# Patient Record
Sex: Female | Born: 1975 | Race: White | Hispanic: No | Marital: Married | Smoking: Never smoker
Health system: Southern US, Community
[De-identification: ages and names within clinical notes are randomized; demographics above are authoritative.]

## PROBLEM LIST (undated history)

## (undated) DIAGNOSIS — N6009 Solitary cyst of unspecified breast: Secondary | ICD-10-CM

## (undated) DIAGNOSIS — F419 Anxiety disorder, unspecified: Secondary | ICD-10-CM

## (undated) DIAGNOSIS — R519 Headache, unspecified: Secondary | ICD-10-CM

## (undated) DIAGNOSIS — T7840XA Allergy, unspecified, initial encounter: Secondary | ICD-10-CM

## (undated) DIAGNOSIS — R87610 Atypical squamous cells of undetermined significance on cytologic smear of cervix (ASC-US): Secondary | ICD-10-CM

## (undated) DIAGNOSIS — R51 Headache: Secondary | ICD-10-CM

## (undated) HISTORY — DX: Solitary cyst of unspecified breast: N60.09

## (undated) HISTORY — PX: TYMPANOSTOMY TUBE PLACEMENT: SHX32

## (undated) HISTORY — PX: TONSILLECTOMY AND ADENOIDECTOMY: SHX28

## (undated) HISTORY — PX: IUD REMOVAL: SHX5392

## (undated) HISTORY — DX: Headache: R51

## (undated) HISTORY — PX: OTHER SURGICAL HISTORY: SHX169

## (undated) HISTORY — DX: Headache, unspecified: R51.9

## (undated) HISTORY — DX: Allergy, unspecified, initial encounter: T78.40XA

## (undated) HISTORY — PX: TEMPOROMANDIBULAR JOINT SURGERY: SHX35

## (undated) HISTORY — DX: Atypical squamous cells of undetermined significance on cytologic smear of cervix (ASC-US): R87.610

## (undated) HISTORY — PX: THERAPEUTIC ABORTION: SHX798

## (undated) HISTORY — DX: Anxiety disorder, unspecified: F41.9

---

## 2000-01-14 ENCOUNTER — Other Ambulatory Visit: Admission: RE | Admit: 2000-01-14 | Discharge: 2000-01-14 | Payer: Self-pay | Admitting: Obstetrics and Gynecology

## 2007-06-17 ENCOUNTER — Other Ambulatory Visit: Admission: RE | Admit: 2007-06-17 | Discharge: 2007-06-17 | Payer: Self-pay | Admitting: Gynecology

## 2008-09-09 ENCOUNTER — Ambulatory Visit: Payer: Self-pay | Admitting: Gynecology

## 2008-09-09 ENCOUNTER — Other Ambulatory Visit: Admission: RE | Admit: 2008-09-09 | Discharge: 2008-09-09 | Payer: Self-pay | Admitting: Gynecology

## 2008-09-09 ENCOUNTER — Encounter: Payer: Self-pay | Admitting: Gynecology

## 2009-09-14 ENCOUNTER — Ambulatory Visit: Payer: Self-pay | Admitting: Gynecology

## 2009-09-14 ENCOUNTER — Other Ambulatory Visit: Admission: RE | Admit: 2009-09-14 | Discharge: 2009-09-14 | Payer: Self-pay | Admitting: Gynecology

## 2010-06-28 ENCOUNTER — Encounter: Admission: RE | Admit: 2010-06-28 | Discharge: 2010-06-28 | Payer: Self-pay | Admitting: Otolaryngology

## 2010-09-17 ENCOUNTER — Other Ambulatory Visit
Admission: RE | Admit: 2010-09-17 | Discharge: 2010-09-17 | Payer: Self-pay | Source: Home / Self Care | Admitting: Gynecology

## 2010-09-17 ENCOUNTER — Ambulatory Visit: Payer: Self-pay | Admitting: Gynecology

## 2010-10-21 ENCOUNTER — Encounter: Payer: Self-pay | Admitting: Otolaryngology

## 2011-01-02 ENCOUNTER — Other Ambulatory Visit: Payer: Self-pay | Admitting: Gynecology

## 2011-01-02 DIAGNOSIS — Z803 Family history of malignant neoplasm of breast: Secondary | ICD-10-CM

## 2011-01-07 ENCOUNTER — Ambulatory Visit
Admission: RE | Admit: 2011-01-07 | Discharge: 2011-01-07 | Disposition: A | Discharge status: Home / Self Care | Payer: BLUE CROSS/BLUE SHIELD | Source: Ambulatory Visit | Attending: Gynecology | Admitting: Gynecology

## 2011-01-07 DIAGNOSIS — Z803 Family history of malignant neoplasm of breast: Secondary | ICD-10-CM

## 2011-09-19 ENCOUNTER — Ambulatory Visit (INDEPENDENT_AMBULATORY_CARE_PROVIDER_SITE_OTHER): Payer: BC Managed Care – PPO | Admitting: Gynecology

## 2011-09-19 ENCOUNTER — Encounter: Payer: Self-pay | Admitting: Gynecology

## 2011-09-19 VITALS — BP 116/64 | Ht 67.0 in | Wt 150.0 lb

## 2011-09-19 DIAGNOSIS — Z13220 Encounter for screening for lipoid disorders: Secondary | ICD-10-CM

## 2011-09-19 DIAGNOSIS — R635 Abnormal weight gain: Secondary | ICD-10-CM

## 2011-09-19 DIAGNOSIS — Z30431 Encounter for routine checking of intrauterine contraceptive device: Secondary | ICD-10-CM

## 2011-09-19 DIAGNOSIS — Z131 Encounter for screening for diabetes mellitus: Secondary | ICD-10-CM

## 2011-09-19 DIAGNOSIS — Z01419 Encounter for gynecological examination (general) (routine) without abnormal findings: Secondary | ICD-10-CM

## 2011-09-19 LAB — COMPREHENSIVE METABOLIC PANEL
AST: 16 U/L (ref 0–37)
Alkaline Phosphatase: 43 U/L (ref 39–117)
BUN: 11 mg/dL (ref 6–23)
Calcium: 9.9 mg/dL (ref 8.4–10.5)
Chloride: 104 mEq/L (ref 96–112)
Creat: 0.8 mg/dL (ref 0.50–1.10)

## 2011-09-19 MED ORDER — ALPRAZOLAM 0.5 MG PO TABS: 0.5000 mg | ORAL_TABLET | Freq: Every evening | ORAL | Status: AC | PRN

## 2011-09-19 NOTE — Patient Instructions (Signed)
Try Xanax as discussed for PMS symptoms. Follow up for annual exam in one year.

## 2011-09-19 NOTE — Progress Notes (Signed)
Cheryl Webster 05-Jun-1976 [id number]        35 y.o.  for annual exam.  Doing well although has several issues noted below  Past medical history,surgical history, medications, allergies, family history and social history were all reviewed and documented in the EPIC chart. ROS:  Was performed and pertinent positives and negatives are included in the history.  Exam: chaperone present Filed Vitals:   09/19/11 0932  BP: 116/64   General appearance  Normal Skin grossly normal Head/Neck normal with no cervical or supraclavicular adenopathy thyroid normal Lungs  clear Cardiac RR, without RMG Abdominal  soft, nontender, without masses, organomegaly or hernia Breasts  examined lying and sitting without masses, retractions, discharge or axillary adenopathy. Pelvic  Ext/BUS/vagina  normal small mole left lower perineum  Cervix  normal  IUD string not visualized grossly. Was seen with the colposcope within the external os.  Uterus  anteverted, normal size, shape and contour, midline and mobile nontender   Adnexa  Without masses or tenderness    Anus and perineum  normal   Rectovaginal  normal sphincter tone without palpated masses or tenderness.    Assessment/Plan:  35 y.o. female for annual exam.    1. PMS symptoms. Patient notes some PMS symptoms several days before her period with anxiety and tension. Options for management were reviewed to include exercise diet fluoxetine and Xanax. Patient's interest in trying Xanax.  I prescribed 0.5 mg #30 one refill to be used 1/2-1 tab when necessary. 2. Weight gain. Patient notes 15 pound weight gain without change in diet and exercise. We'll check TSH and comprehensive metabolic panel. I discussed diet and exercise with her we'll see if this doesn't stabilize. 3. Breast health. SBE monthly reviewed. She did have a screening mammogram 2011 will repeat at 40. 4. IUD management. IUD string was not initially visible but with the colposcope was seen within  the cervical os. She's doing well with scant to absent menses. IUD was placed July 2009. 5. Pap smear. She has 3 consecutive normal Pap smears in her chart the last in 2011. I discussed current guidelines and did not do a Pap smear this year we'll plan every 3 or Pap smears and she agrees with this. 6. Mole left perineum. The patient is a small mole benign in appearance and she reports that has remained unchanged for years. She'll continue to monitor report any change. 7. Health maintenance. Will check baseline CBC lipid profile urinalysis along with her conference of metabolic panel and TSH.    Dara Lords MD, 10:01 AM 09/19/2011

## 2011-12-05 DIAGNOSIS — G629 Polyneuropathy, unspecified: Secondary | ICD-10-CM | POA: Insufficient documentation

## 2012-09-24 ENCOUNTER — Ambulatory Visit (INDEPENDENT_AMBULATORY_CARE_PROVIDER_SITE_OTHER): Payer: BC Managed Care – PPO | Admitting: Gynecology

## 2012-09-24 ENCOUNTER — Encounter: Payer: Self-pay | Admitting: Gynecology

## 2012-09-24 VITALS — BP 110/72 | Ht 66.5 in | Wt 152.0 lb

## 2012-09-24 DIAGNOSIS — Z13220 Encounter for screening for lipoid disorders: Secondary | ICD-10-CM

## 2012-09-24 DIAGNOSIS — Z01419 Encounter for gynecological examination (general) (routine) without abnormal findings: Secondary | ICD-10-CM

## 2012-09-24 DIAGNOSIS — N949 Unspecified condition associated with female genital organs and menstrual cycle: Secondary | ICD-10-CM

## 2012-09-24 DIAGNOSIS — Z131 Encounter for screening for diabetes mellitus: Secondary | ICD-10-CM

## 2012-09-24 DIAGNOSIS — R102 Pelvic and perineal pain: Secondary | ICD-10-CM

## 2012-09-24 LAB — CBC WITH DIFFERENTIAL/PLATELET
Eosinophils Absolute: 0.1 10*3/uL (ref 0.0–0.7)
Hemoglobin: 12.6 g/dL (ref 12.0–15.0)
Lymphs Abs: 2.1 10*3/uL (ref 0.7–4.0)
MCH: 31.3 pg (ref 26.0–34.0)
Monocytes Relative: 6 % (ref 3–12)
Neutrophils Relative %: 61 % (ref 43–77)
RBC: 4.03 MIL/uL (ref 3.87–5.11)

## 2012-09-24 LAB — LIPID PANEL
Cholesterol: 159 mg/dL (ref 0–200)
HDL: 60 mg/dL (ref >39)
Total CHOL/HDL Ratio: 2.7 Ratio
Triglycerides: 163 mg/dL — ABNORMAL HIGH (ref <150)

## 2012-09-24 LAB — GLUCOSE, RANDOM: Glucose, Bld: 92 mg/dL (ref 70–99)

## 2012-09-24 NOTE — Progress Notes (Signed)
Cheryl Webster Dec 28, 1975 [id number]        36 y.o.  [id number] for annual exam.  Several issues below.  Past medical history,surgical history, medications, allergies, family history and social history were all reviewed and documented in the EPIC chart. ROS:  Was performed and pertinent positives and negatives are included in the history.  Exam: Fleet Contras assistant Filed Vitals:   09/24/12 1142  BP: 110/72  Height: 5' 6.5" (1.689 m)  Weight: 152 lb (68.947 kg)   General appearance  Normal Skin grossly normal Head/Neck normal with no cervical or supraclavicular adenopathy thyroid normal Lungs  clear Cardiac RR, without RMG Abdominal  soft, nontender, without masses, organomegaly or hernia Breasts  examined lying and sitting without masses, retractions, discharge or axillary adenopathy. Pelvic  Ext/BUS/vagina  normal with small benign-appearing mole left lower perineum.  Cervix  normal IUD string visualized with colposcope and endocervical speculum  Uterus  anteverted, normal size, shape and contour, midline and mobile nontender   Adnexa  Without masses or tenderness    Anus and perineum  normal   Rectovaginal  normal sphincter tone without palpated masses or tenderness.    Assessment/Plan:  36 y.o. [id number] female for annual exam.   1. Pelvic cramping. Patient notes little more pelvic cramping preceding and following her menses. Also the more breast tenderness. Has light menses every several months. IUD due to be exchanged/removed July 2014. Will check ultrasound rule out nonpalpable abnormalities. Suspect due to IUD coming to the end of 5 years. Follow up for ultrasound and then we'll go from there. 2. Mirena IUD due to be replaced or removed July 2014. Patient knows the timeframe and the need to do so. 3. Mammogram 12/2010 normal. Plan repeat at age 24. SBE monthly reviewed. 4. Pap smear 08/2010. Do Pap smear done today. No history of significant abnormalities. Plan repeat next year at 3  year interval. 5. Perineal mole. Stable on exam and patient's self-assessment. Patient will continue to monitor. Report any changes. 6. Health maintenance. Baseline CBC glucose lipid profile urinalysis ordered. Follow up ultrasound.    Dara Lords MD, 12:11 PM 09/24/2012

## 2012-09-24 NOTE — Patient Instructions (Signed)
Follow up for ultrasound as scheduled 

## 2012-09-25 LAB — URINALYSIS W MICROSCOPIC + REFLEX CULTURE
Bilirubin Urine: NEGATIVE
Glucose, UA: NEGATIVE mg/dL
Hgb urine dipstick: NEGATIVE
Ketones, ur: NEGATIVE mg/dL
Protein, ur: NEGATIVE mg/dL

## 2012-10-01 ENCOUNTER — Encounter: Payer: Self-pay | Admitting: Gynecology

## 2012-10-01 ENCOUNTER — Ambulatory Visit (INDEPENDENT_AMBULATORY_CARE_PROVIDER_SITE_OTHER): Payer: BC Managed Care – PPO

## 2012-10-01 ENCOUNTER — Ambulatory Visit (INDEPENDENT_AMBULATORY_CARE_PROVIDER_SITE_OTHER): Payer: BC Managed Care – PPO | Admitting: Gynecology

## 2012-10-01 DIAGNOSIS — R102 Pelvic and perineal pain: Secondary | ICD-10-CM

## 2012-10-01 DIAGNOSIS — N83209 Unspecified ovarian cyst, unspecified side: Secondary | ICD-10-CM

## 2012-10-01 DIAGNOSIS — N949 Unspecified condition associated with female genital organs and menstrual cycle: Secondary | ICD-10-CM

## 2012-10-01 DIAGNOSIS — T839XXA Unspecified complication of genitourinary prosthetic device, implant and graft, initial encounter: Secondary | ICD-10-CM

## 2012-10-01 NOTE — Patient Instructions (Signed)
Follow up for ultrasound as scheduled in 2-3 months. Follow up sooner if pain worsens or changes.

## 2012-10-01 NOTE — Progress Notes (Signed)
Patient presents for ultrasound. She was complaining of menstrual cramping usually starting before and during her menses. Has Mirena IUD in place due to be replaced July 2014. No pain in between her periods.  Ultrasound shows uterus generous in size with endometrial echo 2.8 mm. IUD visualized in normal position. Right ovary with 3 thin-walled echo-free a vascular cysts measuring 25 mm mean, 30 mm mean, 24 mm mean. Left ovary with 27.5 mm mean a vascular simple cyst. Cul-de-sac negative.  Assessment and plan:. Menstrual cramping. Ultrasound with cystic changes bilaterally benign in appearance. Do not think cysts etiology of her pain given that the pain is usually premenstrual and not throughout the month. Possibilities for endometriosis also reviewed. Although these do not look like classic endometriomas. Benign versus malignant possibilities also discussed. Recommend reultrasound in 2-3 months. Possible laparoscopy if persists or enlarges. Patient understands accepts and is comfortable with the plan.

## 2012-10-02 ENCOUNTER — Ambulatory Visit: Payer: BC Managed Care – PPO | Admitting: Gynecology

## 2012-10-02 ENCOUNTER — Other Ambulatory Visit: Payer: BC Managed Care – PPO

## 2012-11-30 ENCOUNTER — Telehealth: Payer: Self-pay | Admitting: *Deleted

## 2012-11-30 ENCOUNTER — Encounter: Payer: Self-pay | Admitting: Gynecology

## 2012-11-30 ENCOUNTER — Ambulatory Visit (INDEPENDENT_AMBULATORY_CARE_PROVIDER_SITE_OTHER): Payer: BC Managed Care – PPO | Admitting: Gynecology

## 2012-11-30 ENCOUNTER — Ambulatory Visit: Payer: BC Managed Care – PPO | Admitting: Gynecology

## 2012-11-30 ENCOUNTER — Telehealth: Payer: Self-pay | Admitting: Gynecology

## 2012-11-30 DIAGNOSIS — N644 Mastodynia: Secondary | ICD-10-CM

## 2012-11-30 NOTE — Telephone Encounter (Signed)
Appt. 12/01/12 2 9:40 am at breast center.

## 2012-11-30 NOTE — Telephone Encounter (Signed)
Message copied by Aura Camps on Mon Nov 30, 2012 12:44 PM ------      Message from: Dara Lords      Created: Mon Nov 30, 2012 10:58 AM       Arrange ultrasound and diagnostic mammography right breast reference tenderness with patient felt mass tail of Spence not appreciated by physician. Extremely anxious patient ------

## 2012-11-30 NOTE — Telephone Encounter (Signed)
On Call Note:  Found lump in breast. Recommended call office to make appt for evaluation.

## 2012-11-30 NOTE — Progress Notes (Signed)
Patient presents complaining of right breast lump discovered this weekend. History of one week of right breast tenderness and then she felt a small lump that she never noticed before. Her LMP 11/12/2012 was 2 weeks late. Has had regular menses routinely.  Also notes a small bump in front of her left ear opening been there for years fluctuates in size.  Exam with [name] Asst. HEENT with small nodule several millimeters mobile firm in front of her left earlobe. No overlying skin changes or cervical adenopathy. Both breast examined lying and sitting.  No masses retractions discharge adenopathy. She does have more breast tissue in the right tail of Spence but no definitive masses.  Assessment and plan: 1. Small nodule anterior to left ear opening. Then present for years fluctuates I think this is a small sebaceous cyst. She actually saw an ENT physician previously for this who recommended observation and I concur. 2. Questionable breast mass right tail of Spence the patient. Not palpated by physician. She does have more breast tissue here I think that she's having some hormonal changes do to her last irregular cycle. Recommend ultrasound/diagnostic mammogram as the patient is quite anxious. Assuming negative we'll follow him assuming this area resolves after her next menses and will follow. We'll continue to be tender or she thinks she feels anything to followup with me.  Patient knows that we will call her to help arrange the mammogram ultrasound.

## 2012-11-30 NOTE — Patient Instructions (Signed)
Office will contact you to arrange ultrasound and mammogram. Followup when you're due for your ultrasound for the ovarian cyst.

## 2012-11-30 NOTE — Telephone Encounter (Signed)
Order placed at breast center. They will contact patient. 

## 2012-12-01 ENCOUNTER — Ambulatory Visit
Admission: RE | Admit: 2012-12-01 | Discharge: 2012-12-01 | Disposition: A | Discharge status: Home / Self Care | Payer: BC Managed Care – PPO | Source: Ambulatory Visit | Attending: Gynecology | Admitting: Gynecology

## 2012-12-01 DIAGNOSIS — N644 Mastodynia: Secondary | ICD-10-CM

## 2012-12-03 ENCOUNTER — Ambulatory Visit: Payer: BC Managed Care – PPO | Admitting: Gynecology

## 2012-12-03 ENCOUNTER — Other Ambulatory Visit: Payer: BC Managed Care – PPO

## 2012-12-04 ENCOUNTER — Other Ambulatory Visit: Payer: BC Managed Care – PPO

## 2012-12-04 ENCOUNTER — Ambulatory Visit: Payer: BC Managed Care – PPO | Admitting: Gynecology

## 2012-12-23 ENCOUNTER — Ambulatory Visit (INDEPENDENT_AMBULATORY_CARE_PROVIDER_SITE_OTHER): Payer: BC Managed Care – PPO | Admitting: Gynecology

## 2012-12-23 ENCOUNTER — Encounter: Payer: Self-pay | Admitting: Gynecology

## 2012-12-23 ENCOUNTER — Ambulatory Visit (INDEPENDENT_AMBULATORY_CARE_PROVIDER_SITE_OTHER): Payer: BC Managed Care – PPO

## 2012-12-23 DIAGNOSIS — N644 Mastodynia: Secondary | ICD-10-CM

## 2012-12-23 DIAGNOSIS — R102 Pelvic and perineal pain: Secondary | ICD-10-CM

## 2012-12-23 DIAGNOSIS — N83209 Unspecified ovarian cyst, unspecified side: Secondary | ICD-10-CM

## 2012-12-23 DIAGNOSIS — N949 Unspecified condition associated with female genital organs and menstrual cycle: Secondary | ICD-10-CM

## 2012-12-23 DIAGNOSIS — Z30431 Encounter for routine checking of intrauterine contraceptive device: Secondary | ICD-10-CM

## 2012-12-23 NOTE — Patient Instructions (Signed)
Followup in December 2014 for annual exam. Sooner if any issues.

## 2012-12-23 NOTE — Progress Notes (Signed)
Patient presents for followup ultrasound.  Prior ultrasound in January showed bilateral ovarian cystic changes felt to be physiologic and she was asked to represent for repeat ultrasound in several months to make sure these results. The patient also decided to have her IUD removed today. Her husband is going through with a vasectomy and she preferred to have the IUD removed.  She was having some mastalgia at her last visit in March and a questionable mass in the right tail of Spence. Followup mammogram ultrasound showed a small benign-appearing cyst otherwise normal and recommendation was to repeat her mammography at age 72. She notes that she's doing better from a breast discomfort standpoint.  Ultrasound today shows uterus overall normal. IUD located within the cavity. Endometrial echo 2.9 mm. Right ovary with 2 small avascular simple cysts. Left ovary with particular cyst 29 x 24. Cul-de-sac negative.  Exam with Selena Batten assistant External BUS vagina normal. Cervix normal with IUD string visualized. String was grasped with a [place] forcep in her Mirena IUD was removed, shown to her and discarded.  Assessment and plan: 1. Right breast mastalgia, resolved. Evidence of small benign-appearing cyst in the right tail of Spence otherwise normal ultrasound/mammogram. Recommendation per radiology was to followup mammogram at age 89. Represent a any recurrent pain or masses. 2. Bilateral ovarian cystic changes. Smaller on the right. Looks different on the left now hemorrhagic consistent with corpus luteum. Field both are physiologic and no further workup needed and patient agrees. 3. IUD management. The Mirena IUD was removed. Patient knows the need to use contraception until husband has to vasectomy and he is cleared afterwards. Followup December 2014 for annual exam, sooner as needed.

## 2012-12-29 ENCOUNTER — Other Ambulatory Visit: Payer: Self-pay | Admitting: Gynecology

## 2012-12-30 ENCOUNTER — Other Ambulatory Visit: Payer: Self-pay | Admitting: Gynecology

## 2012-12-30 NOTE — Telephone Encounter (Signed)
rx called in. Spoke with Fayrene Fearing

## 2013-02-08 ENCOUNTER — Telehealth: Payer: Self-pay | Admitting: *Deleted

## 2013-02-08 NOTE — Telephone Encounter (Signed)
Pt informed with the below note. 

## 2013-02-08 NOTE — Telephone Encounter (Signed)
If no chance of pregnancy then wait another 1-2 weeks. If without menses and office visit for evaluation. If any chance of pregnancy then check pregnancy test.

## 2013-02-08 NOTE — Telephone Encounter (Signed)
Pt had IUD removed on 12/23/12 pt said she has not had a cycle yet, no chance of pregnancy, she has not took a UPT. Last cycle was in Feb 2014, she had 1 week of breast tenderness but bleeding. Please advise

## 2013-02-26 ENCOUNTER — Ambulatory Visit (INDEPENDENT_AMBULATORY_CARE_PROVIDER_SITE_OTHER): Payer: BC Managed Care – PPO | Admitting: Gynecology

## 2013-02-26 ENCOUNTER — Encounter: Payer: Self-pay | Admitting: Gynecology

## 2013-02-26 DIAGNOSIS — N6001 Solitary cyst of right breast: Secondary | ICD-10-CM

## 2013-02-26 DIAGNOSIS — N644 Mastodynia: Secondary | ICD-10-CM

## 2013-02-26 DIAGNOSIS — R102 Pelvic and perineal pain: Secondary | ICD-10-CM

## 2013-02-26 DIAGNOSIS — N6009 Solitary cyst of unspecified breast: Secondary | ICD-10-CM

## 2013-02-26 DIAGNOSIS — N949 Unspecified condition associated with female genital organs and menstrual cycle: Secondary | ICD-10-CM

## 2013-02-26 NOTE — Progress Notes (Signed)
Patient presents with 2 issues: 1. Right breast pain. History of same over the last several months. Had diagnostic mammogram and ultrasound 11/2012 which showed a small benign-appearing cyst in the upper outer quadrant. She notes that her breast pain has continued on a daily basis. No nipple discharge or other palpable abnormalities other than the staple area where the cyst was palpated. 2. Bilateral pelvic pain. Patient notes pain on both sides of her pelvis starting before her last menstrual period and has continued on for the last 2 weeks. Nagging daily pain. No dyspareunia or abnormal bleeding.   Exam with Blanca Asst. Bilateral breasts examined lying and sitting. Left without masses retractions discharge adenopathy. Right with small pea-sized nodule subcutaneous upper outer quadrant freely mobile no overlying skin changes. No nipple discharge or axillary adenopathy. Abdomen soft nontender without masses guarding rebound organomegaly. Pelvic external BUS vagina normal. Cervix normal. Uterus normal size midline mobile nontender. Adnexa without masses or tenderness  Assessment and plan: 1. Right breast pain. Recent negative mammogram and ultrasound consistent with small benign cyst. Options for Gen. surgical referral now or observation reviewed. Patient would prefer observation we'll plan on heat and nonsteroidal anti-inflammatories. Possible androgen such as danazol reviewed. Patient will call me if this tenderness persists I may refer to general surgeon. 2. Pelvic pain. Recent history of recurrent bilateral ovarian cysts which have resolved spontaneously. We'll go ahead and schedule ultrasound now for reassessment of the pelvis. Patient will followup for this and we'll go from there. Her husband is in the process of arranging vasectomy.

## 2013-02-26 NOTE — Patient Instructions (Signed)
Follow up for ultrasound as scheduled 

## 2013-03-10 ENCOUNTER — Encounter: Payer: Self-pay | Admitting: Gynecology

## 2013-03-10 ENCOUNTER — Ambulatory Visit (INDEPENDENT_AMBULATORY_CARE_PROVIDER_SITE_OTHER): Payer: BC Managed Care – PPO

## 2013-03-10 ENCOUNTER — Ambulatory Visit (INDEPENDENT_AMBULATORY_CARE_PROVIDER_SITE_OTHER): Payer: BC Managed Care – PPO | Admitting: Gynecology

## 2013-03-10 DIAGNOSIS — R102 Pelvic and perineal pain: Secondary | ICD-10-CM

## 2013-03-10 DIAGNOSIS — N831 Corpus luteum cyst of ovary, unspecified side: Secondary | ICD-10-CM

## 2013-03-10 DIAGNOSIS — R197 Diarrhea, unspecified: Secondary | ICD-10-CM

## 2013-03-10 DIAGNOSIS — N949 Unspecified condition associated with female genital organs and menstrual cycle: Secondary | ICD-10-CM

## 2013-03-10 DIAGNOSIS — N83209 Unspecified ovarian cyst, unspecified side: Secondary | ICD-10-CM

## 2013-03-10 DIAGNOSIS — N839 Noninflammatory disorder of ovary, fallopian tube and broad ligament, unspecified: Secondary | ICD-10-CM

## 2013-03-10 NOTE — Progress Notes (Signed)
Patient presents in followup for ultrasound.  Has been following bilateral ovarian cystic changes of the past several months starting in January. Had left ovarian cyst-looking hemorrhagic at 25 mm mean in January. Followup in March was 26 mm. Patient notes that she still is having intermittent lower abdominal discomfort but also notes bouts of diarrhea along with this. She had her IUD removed in March as her husband was planning vasectomy.  Ultrasound shows uterus generous in size with normal echotexture. Endometrial echo 7.5 mm. Right ovary with physiologic changes. Left ovary with particular echo pattern 30 mm mean avascular cysts. Cul-de-sac negative.  Assessment and plan: Persistent left ovarian cyst hemorrhagic in appearance. Ill-defined lower abdominal pain coming and going with diarrhea. Recommend starting with GI evaluation for your irritable bowel. Differential as far as ovaries include physiologic hemorrhagic cyst versus endometrioma highly unlikely neoplasm although possible. Options for laparoscopy now versus re\re ultrasound in 3 months discussed. At this point patient wants to start with GI evaluation. She will followup with me if she wants to proceed with laparoscopy. If she does not proceed with laparoscopy then she knows the need to repeat the ultrasound in 3 months.

## 2013-03-10 NOTE — Patient Instructions (Signed)
Office will contact you to arrange gastroenterology appointment. If you want to proceed with laparoscopy call the office. If we do not proceed with laparoscopy then we need to repeat the ultrasound to followup on the ovarian cyst in 3 months.

## 2013-03-11 ENCOUNTER — Encounter: Payer: Self-pay | Admitting: Gastroenterology

## 2013-03-11 ENCOUNTER — Telehealth: Payer: Self-pay | Admitting: *Deleted

## 2013-03-11 DIAGNOSIS — R1032 Left lower quadrant pain: Secondary | ICD-10-CM

## 2013-03-11 NOTE — Telephone Encounter (Signed)
Appointment on 03/18/13 with Dr.Patterson. Left message for pt to call.

## 2013-03-11 NOTE — Telephone Encounter (Signed)
Message copied by Aura Camps on Thu Mar 11, 2013 11:34 AM ------      Message from: Dara Lords      Created: Wed Mar 10, 2013  4:45 PM       Gastroenterology appointment with [facility]. Reference lower abdominal pain, bouts of diarrhea. ------

## 2013-03-11 NOTE — Telephone Encounter (Signed)
Appt. On 03/18/13 @ 9:45 am

## 2013-03-12 NOTE — Telephone Encounter (Signed)
Pt informed with the below note. 

## 2013-03-16 ENCOUNTER — Encounter: Payer: Self-pay | Admitting: Gastroenterology

## 2013-03-18 ENCOUNTER — Ambulatory Visit: Payer: BC Managed Care – PPO | Admitting: Gastroenterology

## 2013-03-24 ENCOUNTER — Other Ambulatory Visit: Payer: BC Managed Care – PPO

## 2013-03-24 ENCOUNTER — Ambulatory Visit: Payer: BC Managed Care – PPO | Admitting: Gynecology

## 2013-03-25 ENCOUNTER — Encounter: Payer: Self-pay | Admitting: *Deleted

## 2013-03-30 ENCOUNTER — Other Ambulatory Visit (INDEPENDENT_AMBULATORY_CARE_PROVIDER_SITE_OTHER): Payer: BC Managed Care – PPO

## 2013-03-30 ENCOUNTER — Encounter: Payer: Self-pay | Admitting: Gastroenterology

## 2013-03-30 ENCOUNTER — Ambulatory Visit (INDEPENDENT_AMBULATORY_CARE_PROVIDER_SITE_OTHER): Payer: BC Managed Care – PPO | Admitting: Gastroenterology

## 2013-03-30 VITALS — BP 100/64 | HR 58 | Ht 65.75 in | Wt 151.0 lb

## 2013-03-30 DIAGNOSIS — R109 Unspecified abdominal pain: Secondary | ICD-10-CM

## 2013-03-30 DIAGNOSIS — N83209 Unspecified ovarian cyst, unspecified side: Secondary | ICD-10-CM

## 2013-03-30 DIAGNOSIS — R197 Diarrhea, unspecified: Secondary | ICD-10-CM

## 2013-03-30 LAB — IBC PANEL
Iron: 82 ug/dL (ref 42–145)
Transferrin: 219.2 mg/dL (ref 212.0–360.0)

## 2013-03-30 LAB — VITAMIN B12: Vitamin B-12: 681 pg/mL (ref 211–911)

## 2013-03-30 LAB — HEPATIC FUNCTION PANEL
ALT: 14 U/L (ref 0–35)
AST: 16 U/L (ref 0–37)
Albumin: 4.5 g/dL (ref 3.5–5.2)

## 2013-03-30 LAB — CBC WITH DIFFERENTIAL/PLATELET
Basophils Absolute: 0 10*3/uL (ref 0.0–0.1)
Basophils Relative: 0.4 % (ref 0.0–3.0)
Eosinophils Relative: 1.9 % (ref 0.0–5.0)
HCT: 41.5 % (ref 36.0–46.0)
Hemoglobin: 14.1 g/dL (ref 12.0–15.0)
Lymphocytes Relative: 26.5 % (ref 12.0–46.0)
Monocytes Relative: 5.8 % (ref 3.0–12.0)
Neutro Abs: 5.5 10*3/uL (ref 1.4–7.7)
RBC: 4.43 Mil/uL (ref 3.87–5.11)
WBC: 8.3 10*3/uL (ref 4.5–10.5)

## 2013-03-30 LAB — BASIC METABOLIC PANEL
CO2: 29 mEq/L (ref 19–32)
Chloride: 102 mEq/L (ref 96–112)
Creatinine, Ser: 0.8 mg/dL (ref 0.4–1.2)
Glucose, Bld: 94 mg/dL (ref 70–99)

## 2013-03-30 LAB — TSH: TSH: 1.42 u[IU]/mL (ref 0.35–5.50)

## 2013-03-30 LAB — FERRITIN: Ferritin: 27.5 ng/mL (ref 10.0–291.0)

## 2013-03-30 NOTE — Progress Notes (Signed)
History of Present Illness:  This is a 37 year old Caucasian female further evaluation of vague right upper quadrant discomfort with some abdominal gas, bloating, and occasional soft stool.  She has known ovarian cyst is followed by Dr. Audie Box in gynecology.  She also has a benign right breast cyst.  Recently she has become a vegetarian, uses a large amount of fiber, probiotics, and a variety of extracts.  She denies dyspepsia, reflux symptoms, dysphagia, or any history of known hepatobiliary problems, hepatitis or pancreatitis.  Her appetite is good her weight is stable and she denies a specific food intolerances such as lactose or gluten.  She denies melena, hematochezia, and has occasional vague right upper quadrant pain which is exacerbated by a running long distances, but is very transient in nature.  Her family history is remarkable for gallbladder disease in her father in several family members on his side.  Patient not had previous barium studies or endoscopic exams of her intestines.  She denies systemic complaints such as fever, chills, skin rashes, joint pains, or visual difficulties.  Her grandmother did have rectal cancer at age 34.  I have reviewed this patient's present history, medical and surgical past history, allergies and medications.     ROS:   All systems were reviewed and are negative unless otherwise stated in the HPI.  Allergies  Allergen Reactions  . Aleve (Naproxen Sodium)     It was the Aleve cold and sinus therefore patient doesn't take any aleve  . Sulfa Antibiotics    Outpatient Prescriptions Prior to Visit  Medication Sig Dispense Refill  . ALPRAZolam (XANAX) 0.5 MG tablet TAKE 1 TABLET BY MOUTH AT BEDTIME AS NEEDED FOR ANXIETY  30 tablet  1  . Cranberry Extract 200 MG CAPS Take by mouth.      . Lactobacillus (ACIDOPHILUS PO) Take by mouth.        . Multiple Vitamins-Iron (MULTIVITAMIN/IRON PO) Take by mouth.        . Omega-3 Fatty Acids (SALMON OIL PO) Take by  mouth.        . ALPRAZolam (XANAX) 0.25 MG tablet Take 0.25 mg by mouth at bedtime as needed for sleep.      . Cholecalciferol ([employer] VIT D 5000 HIGH-POTENCY PO) Take by mouth.      . ECHINACEA PO Take by mouth. Prn       . levonorgestrel (MIRENA) 20 MCG/24HR IUD 1 each by Intrauterine route once. INSERTED 03/2008.        No facility-administered medications prior to visit.   Past Medical History  Diagnosis Date  . Ovarian cyst   . Anxiety   . Breast cyst right   Past Surgical History  Procedure Laterality Date  . Adenoidectomy  AGE 20  . Temporomandibular joint surgery  AGE13  . Therapeutic abortion  AGE 8, 21    X 2  . Tympanostomy tube placement    . Iud removal     History   Social History  . Marital Status: Married    Spouse Name: N/A    Number of Children: 1  . Years of Education: N/A   Occupational History  . professor    Social History Main Topics  . Smoking status: Never Smoker   . Smokeless tobacco: Never Used  . Alcohol Use: No  . Drug Use: No  . Sexually Active: Yes   Other Topics Concern  . None   Social History Narrative  . None   Family History  Problem  Relation Age of Onset  . Hypertension Father   . Breast cancer Maternal Grandmother   . Rectal cancer Maternal Grandmother   . Heart disease Paternal Grandmother   . Other Father     gallbladder removed       Physical Exam: Blood pressure 100/64, pulse 58 and regular, and weight 151 with a BMI of 24.56. General well developed well nourished patient in no acute distress, appearing her stated age Eyes PERRLA, no icterus, fundoscopic exam per opthamologist Skin no lesions noted Neck supple, no adenopathy, no thyroid enlargement, no tenderness Chest clear to percussion and auscultation Heart no significant murmurs, gallops or rubs noted Abdomen no hepatosplenomegaly masses or tenderness, BS normal.  Rectal inspection normal no fissures, or fistulae noted.  No masses or tenderness on digital  exam. Stool guaiac negative. Extremities no acute joint lesions, edema, phlebitis or evidence of cellulitis. Neurologic patient oriented x 3, cranial nerves intact, no focal neurologic deficits noted. Psychological mental status normal and normal affect.  Assessment and plan: Vague abdominal discomfort, probable IBS exacerbated by excessive by mouth fiber and changes in her diet.  Had a discussion concerning long-distance running and abdominal pain and possible variation of ischemic colitis.  I do not think she needs endoscopy or colonoscopy at this point, but scheduled upper abdominal ultrasound exam to exclude cholelithiasis, and we will check lab data including CBC, sedimentation rate, metabolic profile, and anemia profile, and celiac serologies.  Will see her back in one month's time for followup.  She may need further evaluation or workup and clinical course.  His continue her gynecologic followup as planned.  Encounter Diagnosis  Name Primary?  . Abdominal pain, unspecified site Yes

## 2013-03-30 NOTE — Patient Instructions (Addendum)
  Please follow up in one month with Dr. Mosetta Pigeon have been scheduled for an abdominal ultrasound at [facility] (1st floor of hospital) on 04-05-2013 at 8 am. Please arrive 15 minutes prior to your appointment for registration. Make certain not to have anything to eat or drink 6 hours prior to your appointment. Should you need to reschedule your appointment, please contact radiology at [phone]. This test typically takes about 30 minutes to perform.  Your physician has requested that you go to the basement for the following lab work before leaving today: Anemia Panel Hepatic Function  CBC TSH BMP Celiac Panel  __________________________________________                                               We are excited to introduce MyChart, a new best-in-class service that provides you online access to important information in your electronic medical record. We want to make it easier for you to view your health information - all in one secure location - when and where you need it. We expect MyChart will enhance the quality of care and service we provide.  When you register for MyChart, you can:    View your test results.    Request appointments and receive appointment reminders via email.    Request medication renewals.    View your medical history, allergies, medications and immunizations.    Communicate with your physician's office through a password-protected site.    Conveniently print information such as your medication lists.  To find out if MyChart is right for you, please talk to a member of our clinical staff today. We will gladly answer your questions about this free health and wellness tool.  If you are age 43 or older and want a member of your family to have access to your record, you must provide written consent by completing a proxy form available at our office. Please speak to our clinical staff about guidelines regarding accounts for patients younger than  age 12.  As you activate your MyChart account and need any technical assistance, please call the MyChart technical support line at [phone]) 83-CHART [phone]) or email your question to [email or web address]. If you email your question(s), please include your name, a return phone number and the best time to reach you.  If you have non-urgent health-related questions, you can send a message to our office through MyChart at [email or web address]. If you have a medical emergency, call 911.  Thank you for using MyChart as your new health and wellness resource!   MyChart licensed from [employer],  [id number]. Patents Pending.

## 2013-03-31 LAB — CELIAC PANEL 10
Endomysial Screen: NEGATIVE
IgA: 274 mg/dL (ref 69–380)

## 2013-04-05 ENCOUNTER — Ambulatory Visit (HOSPITAL_COMMUNITY)
Admission: RE | Admit: 2013-04-05 | Discharge: 2013-04-05 | Disposition: A | Discharge status: Home / Self Care | Payer: BC Managed Care – PPO | Source: Ambulatory Visit | Attending: Gastroenterology | Admitting: Gastroenterology

## 2013-04-05 DIAGNOSIS — R109 Unspecified abdominal pain: Secondary | ICD-10-CM | POA: Insufficient documentation

## 2013-10-21 ENCOUNTER — Ambulatory Visit (INDEPENDENT_AMBULATORY_CARE_PROVIDER_SITE_OTHER): Payer: BC Managed Care – PPO | Admitting: Gynecology

## 2013-10-21 ENCOUNTER — Other Ambulatory Visit (HOSPITAL_COMMUNITY)
Admission: RE | Admit: 2013-10-21 | Discharge: 2013-10-21 | Disposition: A | Discharge status: Home / Self Care | Payer: BC Managed Care – PPO | Source: Ambulatory Visit | Attending: Gynecology | Admitting: Gynecology

## 2013-10-21 ENCOUNTER — Encounter: Payer: Self-pay | Admitting: Gynecology

## 2013-10-21 VITALS — BP 120/64 | Ht 66.0 in | Wt 151.0 lb

## 2013-10-21 DIAGNOSIS — Z01419 Encounter for gynecological examination (general) (routine) without abnormal findings: Secondary | ICD-10-CM

## 2013-10-21 DIAGNOSIS — Z1151 Encounter for screening for human papillomavirus (HPV): Secondary | ICD-10-CM | POA: Insufficient documentation

## 2013-10-21 DIAGNOSIS — N83209 Unspecified ovarian cyst, unspecified side: Secondary | ICD-10-CM

## 2013-10-21 LAB — LIPID PANEL
Cholesterol: 169 mg/dL (ref 0–200)
HDL: 62 mg/dL (ref >39)
LDL CALC: 93 mg/dL (ref 0–99)
TRIGLYCERIDES: 72 mg/dL (ref <150)
Total CHOL/HDL Ratio: 2.7 Ratio
VLDL: 14 mg/dL (ref 0–40)

## 2013-10-21 LAB — CBC WITH DIFFERENTIAL/PLATELET
BASOS PCT: 0 % (ref 0–1)
Basophils Absolute: 0 10*3/uL (ref 0.0–0.1)
Eosinophils Absolute: 0.1 10*3/uL (ref 0.0–0.7)
Eosinophils Relative: 1 % (ref 0–5)
HCT: 39 % (ref 36.0–46.0)
HEMOGLOBIN: 13.4 g/dL (ref 12.0–15.0)
LYMPHS ABS: 2.3 10*3/uL (ref 0.7–4.0)
Lymphocytes Relative: 28 % (ref 12–46)
MCH: 31.5 pg (ref 26.0–34.0)
MCHC: 34.4 g/dL (ref 30.0–36.0)
MCV: 91.8 fL (ref 78.0–100.0)
MONOS PCT: 6 % (ref 3–12)
Monocytes Absolute: 0.5 10*3/uL (ref 0.1–1.0)
NEUTROS ABS: 5.2 10*3/uL (ref 1.7–7.7)
NEUTROS PCT: 65 % (ref 43–77)
Platelets: 330 10*3/uL (ref 150–400)
RBC: 4.25 MIL/uL (ref 3.87–5.11)
RDW: 13.5 % (ref 11.5–15.5)
WBC: 8.1 10*3/uL (ref 4.0–10.5)

## 2013-10-21 LAB — COMPREHENSIVE METABOLIC PANEL
ALBUMIN: 4.5 g/dL (ref 3.5–5.2)
ALK PHOS: 43 U/L (ref 39–117)
ALT: 16 U/L (ref 0–35)
AST: 18 U/L (ref 0–37)
BUN: 8 mg/dL (ref 6–23)
CO2: 26 meq/L (ref 19–32)
Calcium: 9.6 mg/dL (ref 8.4–10.5)
Chloride: 103 mEq/L (ref 96–112)
Creat: 0.78 mg/dL (ref 0.50–1.10)
GLUCOSE: 90 mg/dL (ref 70–99)
POTASSIUM: 3.8 meq/L (ref 3.5–5.3)
SODIUM: 138 meq/L (ref 135–145)
TOTAL PROTEIN: 7.1 g/dL (ref 6.0–8.3)
Total Bilirubin: 0.5 mg/dL (ref 0.3–1.2)

## 2013-10-21 MED ORDER — ALPRAZOLAM 0.5 MG PO TABS: ORAL_TABLET | ORAL | Status: DC

## 2013-10-21 NOTE — Patient Instructions (Signed)
Followup for ultrasound as scheduled. Followup in one year for annual exam.

## 2013-10-21 NOTE — Progress Notes (Signed)
Cheryl Webster 05/11/1976 [id number]        38 y.o.  [id number] for annual exam.  Several issues noted below.  Past medical history,surgical history, problem list, medications, allergies, family history and social history were all reviewed and documented in the EPIC chart.  ROS:  Performed and pertinent positives and negatives are included in the history, assessment and plan .  Exam: [name] assistant Filed Vitals:   10/21/13 1409  BP: 120/64  Height: 5\' 6"  (1.676 m)  Weight: 151 lb (68.493 kg)   General appearance  Normal Skin grossly normal Head/Neck normal with no cervical or supraclavicular adenopathy thyroid normal Lungs  clear Cardiac RR, without RMG Abdominal  soft, nontender, without masses, organomegaly or hernia Breasts  examined lying and sitting without masses, retractions, discharge or axillary adenopathy. Pelvic  Ext/BUS/vagina  Normal  Cervix  Normal. Pap/HPV  Uterus  anteverted, normal size, shape and contour, midline and mobile nontender   Adnexa  Without masses or tenderness    Anus and perineum  Normal   Rectovaginal  Normal sphincter tone without palpated masses or tenderness.    Assessment/Plan:  38 y.o. [name] female for annual exam, regular menses, vasectomy birth control.   1. Ovarian cyst. Patient had reticular pattern 30 mm avascular cyst left ovary in June. Was also having some lower abdominal discomfort but this has all resolved. She was to followup for repeat ultrasound in 3 months but never did. We'll go ahead and repeat ultrasound now to make sure that this cyst has resolved. Assuming it has then we'll plan expectant management. Did discuss possibilities to include surgery if it is persistent or enlarging. 2. Mastalgia. Patient does have some bilateral right greater than left mastalgia lateral outer breasts extending into the axilla. Comes and goes throughout her cycle. Exam is normal both to the patient and myself. Suspect hormonal related and at this point  we are both comfortable with observation with planned baseline mammography at age 38. If she has any persistent discomfort or palpable abnormality she knows to represent for further evaluation. SBE monthly reviewed. 3. Pap smear 2011. Pap/HPV today. No history of significant abnormal Pap smears previously. Plan  3 - 5 year Pap smear if normal. 4. Anxiety. Patient has some anxiety with overt panic attacks occasionally. Uses Xanax when necessary but again rarely. He Xanax 0.5 mg #30 with 1 refills provided. 5. Health maintenance. CBC comprehensive metabolic panel lipid profile urinalysis ordered. Follow up for ultrasound otherwise annually.   Note: This document was prepared with digital dictation and possible smart phrase technology. Any transcriptional errors that result from this process are unintentional.   [name],Davy Faught P MD, 3:34 PM 10/21/2013

## 2013-10-22 LAB — URINALYSIS W MICROSCOPIC + REFLEX CULTURE
BILIRUBIN URINE: NEGATIVE
Bacteria, UA: NONE SEEN
CRYSTALS: NONE SEEN
Casts: NONE SEEN
Glucose, UA: NEGATIVE mg/dL
Hgb urine dipstick: NEGATIVE
Ketones, ur: NEGATIVE mg/dL
Leukocytes, UA: NEGATIVE
Nitrite: NEGATIVE
Protein, ur: NEGATIVE mg/dL
SPECIFIC GRAVITY, URINE: 1.015 (ref 1.005–1.030)
SQUAMOUS EPITHELIAL / LPF: NONE SEEN
Urobilinogen, UA: 0.2 mg/dL (ref 0.0–1.0)
pH: 5 (ref 5.0–8.0)

## 2013-11-04 ENCOUNTER — Ambulatory Visit (INDEPENDENT_AMBULATORY_CARE_PROVIDER_SITE_OTHER): Payer: BC Managed Care – PPO | Admitting: Gynecology

## 2013-11-04 ENCOUNTER — Encounter: Payer: Self-pay | Admitting: Gynecology

## 2013-11-04 ENCOUNTER — Ambulatory Visit (INDEPENDENT_AMBULATORY_CARE_PROVIDER_SITE_OTHER): Payer: BC Managed Care – PPO

## 2013-11-04 DIAGNOSIS — N83209 Unspecified ovarian cyst, unspecified side: Secondary | ICD-10-CM

## 2013-11-04 DIAGNOSIS — F4323 Adjustment disorder with mixed anxiety and depressed mood: Secondary | ICD-10-CM

## 2013-11-04 NOTE — Patient Instructions (Signed)
Followup in one year for annual exam, sooner if any issues 

## 2013-11-04 NOTE — Progress Notes (Signed)
Patient follows up for ultrasound. Head 30 mm avascular reticular pattern left ovarian cyst in June 2014. Was to followup for recheck but never did. It  Ultrasound shows uterus normal size and echotexture. Endometrial echo try layered 5.5 mm. Right and left ovaries normal with resolution of the prior cyst. Cul-de-sac negative.  Assessment and plan: Resolved physiologic cyst. Plan expectant management as discussed with the patient. She did ask for referral due to having some anxiety attacks. She tried the Xanax but did not like taking it. I reviewed alternatives to include daily anxiolytic such as Paxil. Ultimately Berniece AndreasJulie Whitt name given and patient agrees to followup with her.

## 2014-03-22 ENCOUNTER — Encounter: Payer: Self-pay | Admitting: Gynecology

## 2014-03-22 ENCOUNTER — Ambulatory Visit (INDEPENDENT_AMBULATORY_CARE_PROVIDER_SITE_OTHER): Payer: BC Managed Care – PPO | Admitting: Gynecology

## 2014-03-22 DIAGNOSIS — N644 Mastodynia: Secondary | ICD-10-CM

## 2014-03-22 NOTE — Progress Notes (Signed)
Cheryl Webster 11/04/1975 [id number]        38 y.o.  [id number] presents with worsening right breast discomfort that comes and goes throughout the month. Does not seem linked to her menses. No masses or nipple discharge on self breast exam. Has had a long history of mastalgia I laterally but this seems to be worse on the right and does not affect the left.  Past medical history,surgical history, problem list, medications, allergies, family history and social history were all reviewed and documented in the EPIC chart.  Directed ROS with pertinent positives and negatives documented in the history of present illness/assessment and plan.  Exam: Cheryl Webster assistant General appearance  Normal Both breast examined lying and sitting without masses retractions discharge adenopathy.  Assessment/Plan:  38 y.o. [name] persistent right breast mastalgia. It does oscillate throughout the month. No abnormalities on self breast exam or on physician exam. Did have a history of mastitis while breast-feeding in the right breast. Had mammogram/ultrasound year and a half ago which was negative. Options for management include observation versus restudying the right breast discussed. Patient would feel more comfortable with restarting the right breast and will order a diagnostic mammogram and ultrasound of the right breast. Otherwise assuming negative she'll continue self breast exams and as long as no palpable abnormalities in the symptoms come and go that will monitor. Options for treatment to include androgen based such as danazol or other hormonal manipulation reviewed but declined.   Note: This document was prepared with digital dictation and possible smart phrase technology. Any transcriptional errors that result from this process are unintentional.   Cheryl Webster,TIMOTHY P MD, 12:43 PM 03/22/2014

## 2014-03-22 NOTE — Patient Instructions (Addendum)
Office will call you to arrange the diagnostic mammogram and ultrasound. If you do not hear from the office in one week please call us.

## 2014-07-29 ENCOUNTER — Encounter: Payer: Self-pay | Admitting: Gynecology

## 2014-07-29 ENCOUNTER — Ambulatory Visit (INDEPENDENT_AMBULATORY_CARE_PROVIDER_SITE_OTHER): Payer: BC Managed Care – PPO | Admitting: Gynecology

## 2014-07-29 DIAGNOSIS — N938 Other specified abnormal uterine and vaginal bleeding: Secondary | ICD-10-CM

## 2014-07-29 NOTE — Patient Instructions (Signed)
Call if your irregular bleeding continues.   Dysfunctional Uterine Bleeding Normally, menstrual periods begin between ages 7311 to 3817 in young women. A normal menstrual cycle/period may begin every 23 days up to 35 days and lasts from 1 to 7 days. Around 12 to 14 days before your menstrual period starts, ovulation (ovary produces an egg) occurs. When counting the time between menstrual periods, count from the first day of bleeding of the previous period to the first day of bleeding of the next period. Dysfunctional (abnormal) uterine bleeding is bleeding that is different from a normal menstrual period. Your periods may come earlier or later than usual. They may be lighter, have blood clots or be heavier. You may have bleeding between periods, or you may skip one period or more. You may have bleeding after sexual intercourse, bleeding after menopause, or no menstrual period. CAUSES   Pregnancy (normal, miscarriage, tubal).  IUDs (intrauterine device, birth control).  Birth control pills.  Hormone treatment.  Menopause.  Infection of the cervix.  Blood clotting problems.  Infection of the inside lining of the uterus.  Endometriosis, inside lining of the uterus growing in the pelvis and other female organs.  Adhesions (scar tissue) inside the uterus.  Obesity or severe weight loss.  Uterine polyps inside the uterus.  Cancer of the vagina, cervix, or uterus.  Ovarian cysts or polycystic ovary syndrome.  Medical problems (diabetes, thyroid disease).  Uterine fibroids (noncancerous tumor).  Problems with your female hormones.  Endometrial hyperplasia, very thick lining and enlarged cells inside of the uterus.  Medicines that interfere with ovulation.  Radiation to the pelvis or abdomen.  Chemotherapy. DIAGNOSIS   Your doctor will discuss the history of your menstrual periods, medicines you are taking, changes in your weight, stress in your life, and any medical problems  you may have.  Your doctor will do a physical and pelvic examination.  Your doctor may want to perform certain tests to make a diagnosis, such as:  Pap test.  Blood tests.  Cultures for infection.  CT scan.  Ultrasound.  Hysteroscopy.  Laparoscopy.  MRI.  Hysterosalpingography.  D and C.  Endometrial biopsy. TREATMENT  Treatment will depend on the cause of the dysfunctional uterine bleeding (DUB). Treatment may include:  Observing your menstrual periods for a couple of months.  Prescribing medicines for medical problems, including:  Antibiotics.  Hormones.  Birth control pills.  Removing an IUD (intrauterine device, birth control).  Surgery:  D and C (scrape and remove tissue from inside the uterus).  Laparoscopy (examine inside the abdomen with a lighted tube).  Uterine ablation (destroy lining of the uterus with electrical current, laser, heat, or freezing).  Hysteroscopy (examine cervix and uterus with a lighted tube).  Hysterectomy (remove the uterus). HOME CARE INSTRUCTIONS   If medicines were prescribed, take exactly as directed. Do not change or switch medicines without consulting your caregiver.  Long term heavy bleeding may result in iron deficiency. Your caregiver may have prescribed iron pills. They help replace the iron that your body lost from heavy bleeding. Take exactly as directed.  Do not take aspirin or medicines that contain aspirin one week before or during your menstrual period. Aspirin may make the bleeding worse.  If you need to change your sanitary pad or tampon more than once every 2 hours, stay in bed with your feet elevated and a cold pack on your lower abdomen. Rest as much as possible, until the bleeding stops or slows down.  Eat  well-balanced meals. Eat foods high in iron. Examples are:  Leafy green vegetables.  Whole-grain breads and cereals.  Eggs.  Meat.  Liver.  Do not try to lose weight until the abnormal  bleeding has stopped and your blood iron level is back to normal. Do not lift more than ten pounds or do strenuous activities when you are bleeding.  For a couple of months, make note on your calendar, marking the start and ending of your period, and the type of bleeding (light, medium, heavy, spotting, clots or missed periods). This is for your caregiver to better evaluate your problem. SEEK MEDICAL CARE IF:   You develop nausea (feeling sick to your stomach) and vomiting, dizziness, or diarrhea while you are taking your medicine.  You are getting lightheaded or weak.  You have any problems that may be related to the medicine you are taking.  You develop pain with your DUB.  You want to remove your IUD.  You want to stop or change your birth control pills or hormones.  You have any type of abnormal bleeding mentioned above.  You are over 789 years old and have not had a menstrual period yet.  You are 38 years old and you are still having menstrual periods.  You have any of the symptoms mentioned above.  You develop a rash. SEEK IMMEDIATE MEDICAL CARE IF:   An oral temperature above 102 F (38.9 C) develops.  You develop chills.  You are changing your sanitary pad or tampon more than once an hour.  You develop abdominal pain.  You pass out or faint. Document Released: 09/13/2000 Document Revised: 12/09/2011 Document Reviewed: 08/15/2009 [facility] Patient Information 2015 [employer] [place]. This information is not intended to replace advice given to you by your health care provider. Make sure you discuss any questions you have with your health care provider.

## 2014-07-29 NOTE — Progress Notes (Signed)
Cheryl Webster 04/29/1976 [id number]        38 y.o.  [id number] Presents with a history of regular monthly menses every 28 days. Started bleeding several days ago midcycle which varies from spotting to passing clots. No history of this previously. No pain/cramping. No precipitating event. Vasectomy birth control  Past medical history,surgical history, problem list, medications, allergies, family history and social history were all reviewed and documented in the EPIC chart.  Directed ROS with pertinent positives and negatives documented in the history of present illness/assessment and plan.  Exam: [name] assistant General appearance:  Normal Abdomen soft nontender without masses guarding rebound organomegaly. Pelvic external BUS vagina with slight menses flow. Cervix normal. Uterus anteverted normal size midline mobile nontender. Adnexa without masses or tenderness.  Assessment/Plan:  38 y.o. [name] first episode of dysfunctional bleeding. Vasectomy birth control. Without other symptoms such as pain or cramping. Recommend observation for now. Await next menses. If irregular bleeding continues then call and we'll start an evaluation to include TSH prolactin and sonohysterogram. If resumes regular menses then will follow expectantly through January when she is due for her annual exam.     Cheryl Webster,Cheryl Manger P MD, 10:27 AM 07/29/2014

## 2014-08-01 ENCOUNTER — Encounter: Payer: Self-pay | Admitting: Gynecology

## 2014-08-05 ENCOUNTER — Ambulatory Visit: Payer: BC Managed Care – PPO | Admitting: Women's Health

## 2014-10-24 ENCOUNTER — Ambulatory Visit (INDEPENDENT_AMBULATORY_CARE_PROVIDER_SITE_OTHER): Payer: BLUE CROSS/BLUE SHIELD | Admitting: Licensed Clinical Social Worker

## 2014-10-24 DIAGNOSIS — F419 Anxiety disorder, unspecified: Secondary | ICD-10-CM

## 2014-10-31 ENCOUNTER — Other Ambulatory Visit: Payer: Self-pay | Admitting: Family Medicine

## 2014-10-31 DIAGNOSIS — R599 Enlarged lymph nodes, unspecified: Secondary | ICD-10-CM

## 2014-11-01 ENCOUNTER — Other Ambulatory Visit: Payer: Self-pay | Admitting: Family Medicine

## 2014-11-01 DIAGNOSIS — R599 Enlarged lymph nodes, unspecified: Secondary | ICD-10-CM

## 2014-11-07 ENCOUNTER — Ambulatory Visit (INDEPENDENT_AMBULATORY_CARE_PROVIDER_SITE_OTHER): Payer: BLUE CROSS/BLUE SHIELD | Admitting: Licensed Clinical Social Worker

## 2014-11-07 DIAGNOSIS — F419 Anxiety disorder, unspecified: Secondary | ICD-10-CM

## 2014-11-09 ENCOUNTER — Ambulatory Visit
Admission: RE | Admit: 2014-11-09 | Discharge: 2014-11-09 | Disposition: A | Discharge status: Home / Self Care | Payer: BLUE CROSS/BLUE SHIELD | Source: Ambulatory Visit | Attending: Family Medicine | Admitting: Family Medicine

## 2014-11-09 DIAGNOSIS — R599 Enlarged lymph nodes, unspecified: Secondary | ICD-10-CM

## 2014-11-21 ENCOUNTER — Ambulatory Visit (INDEPENDENT_AMBULATORY_CARE_PROVIDER_SITE_OTHER): Payer: BLUE CROSS/BLUE SHIELD | Admitting: Gynecology

## 2014-11-21 ENCOUNTER — Encounter: Payer: Self-pay | Admitting: Gynecology

## 2014-11-21 VITALS — BP 114/70 | Ht 66.5 in | Wt 156.0 lb

## 2014-11-21 DIAGNOSIS — N644 Mastodynia: Secondary | ICD-10-CM

## 2014-11-21 DIAGNOSIS — Z01419 Encounter for gynecological examination (general) (routine) without abnormal findings: Secondary | ICD-10-CM

## 2014-11-21 NOTE — Patient Instructions (Signed)
You may obtain a copy of any labs that were done today by logging onto MyChart as outlined in the instructions provided with your AVS (after visit summary). The office will not call with normal lab results but certainly if there are any significant abnormalities then we will contact you.   Health Maintenance, Female A healthy lifestyle and preventative care can promote health and wellness.  Maintain regular health, dental, and eye exams.  Eat a healthy diet. Foods like vegetables, fruits, whole grains, low-fat dairy products, and lean protein foods contain the nutrients you need without too many calories. Decrease your intake of foods high in solid fats, added sugars, and salt. Get information about a proper diet from your caregiver, if necessary.  Regular physical exercise is one of the most important things you can do for your health. Most adults should get at least 150 minutes of moderate-intensity exercise (any activity that increases your heart rate and causes you to sweat) each week. In addition, most adults need muscle-strengthening exercises on 2 or more days a week.   Maintain a healthy weight. The body mass index (BMI) is a screening tool to identify possible weight problems. It provides an estimate of body fat based on height and weight. Your caregiver can help determine your BMI, and can help you achieve or maintain a healthy weight. For adults 20 years and older:  A BMI below 18.5 is considered underweight.  A BMI of 18.5 to 24.9 is normal.  A BMI of 25 to 29.9 is considered overweight.  A BMI of 30 and above is considered obese.  Maintain normal blood lipids and cholesterol by exercising and minimizing your intake of saturated fat. Eat a balanced diet with plenty of fruits and vegetables. Blood tests for lipids and cholesterol should begin at age 61 and be repeated every 5 years. If your lipid or cholesterol levels are high, you are over 50, or you are a high risk for heart  disease, you may need your cholesterol levels checked more frequently.Ongoing high lipid and cholesterol levels should be treated with medicines if diet and exercise are not effective.  If you smoke, find out from your caregiver how to quit. If you do not use tobacco, do not start.  Lung cancer screening is recommended for adults aged 33 80 years who are at high risk for developing lung cancer because of a history of smoking. Yearly low-dose computed tomography (CT) is recommended for people who have at least a 30-pack-year history of smoking and are a current smoker or have quit within the past 15 years. A pack year of smoking is smoking an average of 1 pack of cigarettes a day for 1 year (for example: 1 pack a day for 30 years or 2 packs a day for 15 years). Yearly screening should continue until the smoker has stopped smoking for at least 15 years. Yearly screening should also be stopped for people who develop a health problem that would prevent them from having lung cancer treatment.  If you are pregnant, do not drink alcohol. If you are breastfeeding, be very cautious about drinking alcohol. If you are not pregnant and choose to drink alcohol, do not exceed 1 drink per day. One drink is considered to be 12 ounces (355 mL) of beer, 5 ounces (148 mL) of wine, or 1.5 ounces (44 mL) of liquor.  Avoid use of street drugs. Do not share needles with anyone. Ask for help if you need support or instructions about stopping  the use of drugs.  High blood pressure causes heart disease and increases the risk of stroke. Blood pressure should be checked at least every 1 to 2 years. Ongoing high blood pressure should be treated with medicines, if weight loss and exercise are not effective.  If you are 59 to 39 years old, ask your caregiver if you should take aspirin to prevent strokes.  Diabetes screening involves taking a blood sample to check your fasting blood sugar level. This should be done once every 3  years, after age 91, if you are within normal weight and without risk factors for diabetes. Testing should be considered at a younger age or be carried out more frequently if you are overweight and have at least 1 risk factor for diabetes.  Breast cancer screening is essential preventative care for women. You should practice "breast self-awareness." This means understanding the normal appearance and feel of your breasts and may include breast self-examination. Any changes detected, no matter how small, should be reported to a caregiver. Women in their 66s and 30s should have a clinical breast exam (CBE) by a caregiver as part of a regular health exam every 1 to 3 years. After age 101, women should have a CBE every year. Starting at age 100, women should consider having a mammogram (breast X-ray) every year. Women who have a family history of breast cancer should talk to their caregiver about genetic screening. Women at a high risk of breast cancer should talk to their caregiver about having an MRI and a mammogram every year.  Breast cancer gene (BRCA)-related cancer risk assessment is recommended for women who have family members with BRCA-related cancers. BRCA-related cancers include breast, ovarian, tubal, and peritoneal cancers. Having family members with these cancers may be associated with an increased risk for harmful changes (mutations) in the breast cancer genes BRCA1 and BRCA2. Results of the assessment will determine the need for genetic counseling and BRCA1 and BRCA2 testing.  The Pap test is a screening test for cervical cancer. Women should have a Pap test starting at age 57. Between ages 25 and 35, Pap tests should be repeated every 2 years. Beginning at age 37, you should have a Pap test every 3 years as long as the past 3 Pap tests have been normal. If you had a hysterectomy for a problem that was not cancer or a condition that could lead to cancer, then you no longer need Pap tests. If you are  between ages 50 and 76, and you have had normal Pap tests going back 10 years, you no longer need Pap tests. If you have had past treatment for cervical cancer or a condition that could lead to cancer, you need Pap tests and screening for cancer for at least 20 years after your treatment. If Pap tests have been discontinued, risk factors (such as a new sexual partner) need to be reassessed to determine if screening should be resumed. Some women have medical problems that increase the chance of getting cervical cancer. In these cases, your caregiver may recommend more frequent screening and Pap tests.  The human papillomavirus (HPV) test is an additional test that may be used for cervical cancer screening. The HPV test looks for the virus that can cause the cell changes on the cervix. The cells collected during the Pap test can be tested for HPV. The HPV test could be used to screen women aged 44 years and older, and should be used in women of any age  who have unclear Pap test results. After the age of 55, women should have HPV testing at the same frequency as a Pap test.  Colorectal cancer can be detected and often prevented. Most routine colorectal cancer screening begins at the age of 44 and continues through age 20. However, your caregiver may recommend screening at an earlier age if you have risk factors for colon cancer. On a yearly basis, your caregiver may provide home test kits to check for hidden blood in the stool. Use of a small camera at the end of a tube, to directly examine the colon (sigmoidoscopy or colonoscopy), can detect the earliest forms of colorectal cancer. Talk to your caregiver about this at age 86, when routine screening begins. Direct examination of the colon should be repeated every 5 to 10 years through age 13, unless early forms of pre-cancerous polyps or small growths are found.  Hepatitis C blood testing is recommended for all people born from [birth year] through 1965 and any  individual with known risks for hepatitis C.  Practice safe sex. Use condoms and avoid high-risk sexual practices to reduce the spread of sexually transmitted infections (STIs). Sexually active women aged 36 and younger should be checked for Chlamydia, which is a common sexually transmitted infection. Older women with new or multiple partners should also be tested for Chlamydia. Testing for other STIs is recommended if you are sexually active and at increased risk.  Osteoporosis is a disease in which the bones lose minerals and strength with aging. This can result in serious bone fractures. The risk of osteoporosis can be identified using a bone density scan. Women ages 20 and over and women at risk for fractures or osteoporosis should discuss screening with their caregivers. Ask your caregiver whether you should be taking a calcium supplement or vitamin D to reduce the rate of osteoporosis.  Menopause can be associated with physical symptoms and risks. Hormone replacement therapy is available to decrease symptoms and risks. You should talk to your caregiver about whether hormone replacement therapy is right for you.  Use sunscreen. Apply sunscreen liberally and repeatedly throughout the day. You should seek shade when your shadow is shorter than you. Protect yourself by wearing long sleeves, pants, a wide-brimmed hat, and sunglasses year round, whenever you are outdoors.  Notify your caregiver of new moles or changes in moles, especially if there is a change in shape or color. Also notify your caregiver if a mole is larger than the size of a pencil eraser.  Stay current with your immunizations. Document Released: 04/01/2011 Document Revised: 01/11/2013 Document Reviewed: 04/01/2011 [facility] Patient Information 2014 [employer].

## 2014-11-21 NOTE — Progress Notes (Signed)
Cheryl Webster 06/16/1976 [id number]        39 y.o.  [id number] for annual exam.  Several issues noted below.  Past medical history,surgical history, problem list, medications, allergies, family history and social history were all reviewed and documented as reviewed in the EPIC chart.  ROS:  Performed with pertinent positives and negatives included in the history, assessment and plan.   Additional significant findings :  none   Exam: [name] personassistant Filed Vitals:   11/21/14 0841  BP: 114/70  Height: 5' 6.5" (1.689 m)  Weight: 156 lb (70.761 kg)   General appearance:  Normal affect, orientation and appearance. Skin: Grossly normal HEENT: Without gross lesions.  No cervical or supraclavicular adenopathy. Thyroid normal.  Lungs:  Clear without wheezing, rales or rhonchi Cardiac: RR, without RMG Abdominal:  Soft, nontender, without masses, guarding, rebound, organomegaly or hernia Breasts:  Examined lying and sitting without masses, retractions, discharge or axillary adenopathy. Pelvic:  Ext/BUS/vagina normal  Cervix normal  Uterus anteverted, normal size, shape and contour, midline and mobile nontender   Adnexa  Without masses or tenderness    Anus and perineum  Normal   Rectovaginal  Normal sphincter tone without palpated masses or tenderness.    Assessment/Plan:  39 y.o. [name] female for annual exam with regular menses, vasectomy birth control.   1. History of some menstrual irregularity but now with regular menses. Will keep a menstrual calendar and report any significant irregularity. 2. Right breast mastalgia.  Had mammography and ultrasound 10/2014 which showed several small cysts. No palpable abnormalities on self-exam or physician exam. Continue with SBE monthly. Report any palpable abnormalities. Trial of heat over the area. Continue with annual mammography is recommended by radiology. 3. Pap smear/HPV negative 09/2013. No Pap smear done today. No history of significant abnormal  Pap smears. Plan repeat Pap smear at 3-5 year interval. 4. History of anxiety. Seeing Berniece AndreasJulie Whitt and doing well with this. 5. Health maintenance. Baseline CBC, comprehensive metabolic panel, lipid profile, urinalysis ordered. Follow up in one year, sooner as needed.     [name],Kamaree Wheatley P MD, 9:01 AM 11/21/2014

## 2014-11-22 LAB — CBC WITH DIFFERENTIAL/PLATELET
Basophils Absolute: 0 10*3/uL (ref 0.0–0.1)
Basophils Relative: 1 % (ref 0–1)
Eosinophils Absolute: 0.1 10*3/uL (ref 0.0–0.7)
Eosinophils Relative: 2 % (ref 0–5)
HCT: 38.4 % (ref 36.0–46.0)
Hemoglobin: 13 g/dL (ref 12.0–15.0)
LYMPHS PCT: 28 % (ref 12–46)
Lymphs Abs: 1.4 10*3/uL (ref 0.7–4.0)
MCH: 31.1 pg (ref 26.0–34.0)
MCHC: 33.9 g/dL (ref 30.0–36.0)
MCV: 91.9 fL (ref 78.0–100.0)
MONOS PCT: 6 % (ref 3–12)
MPV: 10 fL (ref 8.6–12.4)
Monocytes Absolute: 0.3 10*3/uL (ref 0.1–1.0)
NEUTROS ABS: 3.1 10*3/uL (ref 1.7–7.7)
Neutrophils Relative %: 63 % (ref 43–77)
PLATELETS: 304 10*3/uL (ref 150–400)
RBC: 4.18 MIL/uL (ref 3.87–5.11)
RDW: 13.6 % (ref 11.5–15.5)
WBC: 4.9 10*3/uL (ref 4.0–10.5)

## 2014-11-22 LAB — COMPREHENSIVE METABOLIC PANEL
ALT: 12 U/L (ref 0–35)
AST: 12 U/L (ref 0–37)
Albumin: 4 g/dL (ref 3.5–5.2)
Alkaline Phosphatase: 36 U/L — ABNORMAL LOW (ref 39–117)
BUN: 8 mg/dL (ref 6–23)
CALCIUM: 9.4 mg/dL (ref 8.4–10.5)
CO2: 23 mEq/L (ref 19–32)
CREATININE: 0.76 mg/dL (ref 0.50–1.10)
Chloride: 106 mEq/L (ref 96–112)
Glucose, Bld: 95 mg/dL (ref 70–99)
Potassium: 4.5 mEq/L (ref 3.5–5.3)
SODIUM: 138 meq/L (ref 135–145)
Total Bilirubin: 0.6 mg/dL (ref 0.2–1.2)
Total Protein: 6.4 g/dL (ref 6.0–8.3)

## 2014-11-22 LAB — LIPID PANEL
Cholesterol: 163 mg/dL (ref 0–200)
HDL: 53 mg/dL (ref >=46)
LDL Cholesterol: 97 mg/dL (ref 0–99)
Total CHOL/HDL Ratio: 3.1 Ratio
Triglycerides: 67 mg/dL (ref <150)
VLDL: 13 mg/dL (ref 0–40)

## 2014-11-22 LAB — URINALYSIS W MICROSCOPIC + REFLEX CULTURE
Bacteria, UA: NONE SEEN
Bilirubin Urine: NEGATIVE
CRYSTALS: NONE SEEN
Casts: NONE SEEN
Glucose, UA: NEGATIVE mg/dL
Hgb urine dipstick: NEGATIVE
KETONES UR: NEGATIVE mg/dL
LEUKOCYTES UA: NEGATIVE
NITRITE: NEGATIVE
Protein, ur: NEGATIVE mg/dL
Squamous Epithelial / LPF: NONE SEEN
Urobilinogen, UA: 0.2 mg/dL (ref 0.0–1.0)
pH: 7 (ref 5.0–8.0)

## 2014-12-05 ENCOUNTER — Ambulatory Visit: Payer: BLUE CROSS/BLUE SHIELD | Admitting: Licensed Clinical Social Worker

## 2015-06-13 ENCOUNTER — Ambulatory Visit: Payer: BLUE CROSS/BLUE SHIELD | Admitting: Gynecology

## 2015-06-19 ENCOUNTER — Other Ambulatory Visit: Payer: Self-pay | Admitting: Family Medicine

## 2015-06-19 DIAGNOSIS — R1011 Right upper quadrant pain: Secondary | ICD-10-CM

## 2015-06-20 ENCOUNTER — Ambulatory Visit (INDEPENDENT_AMBULATORY_CARE_PROVIDER_SITE_OTHER): Payer: BLUE CROSS/BLUE SHIELD | Admitting: Women's Health

## 2015-06-20 ENCOUNTER — Encounter: Payer: Self-pay | Admitting: Women's Health

## 2015-06-20 VITALS — BP 126/80 | Ht 66.0 in | Wt 150.0 lb

## 2015-06-20 DIAGNOSIS — R1031 Right lower quadrant pain: Secondary | ICD-10-CM | POA: Diagnosis not present

## 2015-06-20 NOTE — Progress Notes (Signed)
Patient ID: Cheryl Webster, female   DOB: [birth year], 39 y.o.   MRN: [id number] Presents with complaint of questionable ovarian cyst. Has had problems with ovarian cysts in the past that resolved spontaneously. Regular monthly cycles/vasectomy. Has had a cycle since pain started with no change in pain after cycle. Has been a runner always has had increased pain intermittently in the past month while training for half marathon. Pain is right lower quadrant, occasionally radiates to the left lower quadrant after running several miles pain will start. States pain is interfering with running routine/straining.  Denies urinary symptoms, vaginal discharge, nausea, change in bowel/ bladder elimination or fever. Had a negative urine, normal CBC at primary care. Denies change in routine other than training.  Exam: Appears well. Mild discomfort with deep palpation  right lower quadrant, no rebound or radiation of pain. No CVAT. Speculum exam no discharge, erythema.  Intermittent right lower quadrant pain exacerbated by running  Plan: Ultrasound . Will schedule.

## 2015-07-07 ENCOUNTER — Ambulatory Visit (INDEPENDENT_AMBULATORY_CARE_PROVIDER_SITE_OTHER): Payer: BLUE CROSS/BLUE SHIELD | Admitting: Women's Health

## 2015-07-07 ENCOUNTER — Ambulatory Visit (INDEPENDENT_AMBULATORY_CARE_PROVIDER_SITE_OTHER): Payer: BLUE CROSS/BLUE SHIELD

## 2015-07-07 DIAGNOSIS — R1031 Right lower quadrant pain: Secondary | ICD-10-CM

## 2015-07-07 NOTE — Progress Notes (Signed)
Patient ID: Cheryl Webster, female   DOB: 1976/07/04, 39 y.o.   MRN: [id number] Presents for ultrasound for persistent right lower quadrant discomfort increased with running for the past month. Monthly cycles/vasectomy. Currently training for half marathons. Denies fever, nausea, change in bowel elimination or urinary symptoms.  Exam: Appears well. Ultrasound: T/V uterus anteverted homogeneous. Endometrium within normal limits, 3.2 mm. Right and left ovary normal. Negative cul-de-sac. No apparent mass right or left adnexal.  Abdomen soft, no rebound or radiation of pain, mild discomfort with deep palpation right lower quadrant.  Normal GYN ultrasound  Plan: Will watch at this time, reassurance regarding normality of ultrasound. If pain persists or changes follow-up with primary care.

## 2015-09-15 ENCOUNTER — Ambulatory Visit (INDEPENDENT_AMBULATORY_CARE_PROVIDER_SITE_OTHER): Payer: BLUE CROSS/BLUE SHIELD | Admitting: Gynecology

## 2015-09-15 ENCOUNTER — Encounter: Payer: Self-pay | Admitting: Gynecology

## 2015-09-15 VITALS — BP 118/70

## 2015-09-15 DIAGNOSIS — N912 Amenorrhea, unspecified: Secondary | ICD-10-CM

## 2015-09-15 MED ORDER — MEDROXYPROGESTERONE ACETATE 10 MG PO TABS: 10.0000 mg | ORAL_TABLET | Freq: Every day | ORAL | Status: DC

## 2015-09-15 NOTE — Progress Notes (Signed)
Cheryl Webster 03/30/1976 [id number]        39 y.o.  [name] LMP 08/01/2015  Presents having skipped her menses this month. Having regular monthly menses proceeding. Vasectomy birth control.  No hair, skin, weight changes. Is under stress with school work. No galactorrhea  Past medical history,surgical history, problem list, medications, allergies, family history and social history were all reviewed and documented in the EPIC chart.  Directed ROS with pertinent positives and negatives documented in the history of present illness/assessment and plan.  Exam: [name] assistant Filed Vitals:   09/15/15 0913  BP: 118/70   General appearance:  Normal Abdomen soft nontender without masses guarding rebound Pelvic external BUS vagina normal. Cervix normal. Uterus anteverted normal size midline mobile nontender. Adnexa without masses or tenderness  Assessment/Plan:  39 y.o. [id number] with single episode of amenorrhea. We'll check a qualitative hCG. Provera 10 mg 10 days prescription written. She can wait a week to see if she doesn't start on her own and if not she'll take the Provera. Assuming she has a withdrawal bleed she'll keep a menstrual calendar and if resumes regular menses will follow. If irregularity or amenorrhea continues she'll follow up for more involved evaluation to include FSH TSH prolactin. Possible ultrasound.    [name],Maragret Vanacker P MD, 9:25 AM 09/15/2015

## 2015-09-15 NOTE — Patient Instructions (Signed)
If you do not start her period in 1 week take the Provera medication for 10 days and this should bring on her period. If you do not resume regular menses following this call and we will start a more involved evaluation.

## 2015-09-16 LAB — HCG, SERUM, QUALITATIVE: Preg, Serum: NEGATIVE

## 2015-10-23 ENCOUNTER — Other Ambulatory Visit: Payer: Self-pay

## 2015-10-23 DIAGNOSIS — Z1231 Encounter for screening mammogram for malignant neoplasm of breast: Secondary | ICD-10-CM

## 2015-11-06 LAB — HM MAMMOGRAPHY

## 2015-11-16 ENCOUNTER — Other Ambulatory Visit: Payer: Self-pay

## 2015-11-16 ENCOUNTER — Ambulatory Visit
Admission: RE | Admit: 2015-11-16 | Discharge: 2015-11-16 | Disposition: A | Discharge status: Home / Self Care | Payer: BLUE CROSS/BLUE SHIELD | Source: Ambulatory Visit

## 2015-11-16 ENCOUNTER — Ambulatory Visit: Payer: Self-pay

## 2015-11-16 DIAGNOSIS — Z1231 Encounter for screening mammogram for malignant neoplasm of breast: Secondary | ICD-10-CM

## 2015-11-23 ENCOUNTER — Ambulatory Visit: Payer: Self-pay

## 2015-11-24 ENCOUNTER — Encounter: Payer: Self-pay | Admitting: Gynecology

## 2015-11-24 ENCOUNTER — Ambulatory Visit (INDEPENDENT_AMBULATORY_CARE_PROVIDER_SITE_OTHER): Payer: BLUE CROSS/BLUE SHIELD | Admitting: Gynecology

## 2015-11-24 VITALS — BP 120/64 | Ht 66.0 in | Wt 154.0 lb

## 2015-11-24 DIAGNOSIS — Z01419 Encounter for gynecological examination (general) (routine) without abnormal findings: Secondary | ICD-10-CM

## 2015-11-24 LAB — CBC WITH DIFFERENTIAL/PLATELET
BASOS ABS: 0.1 10*3/uL (ref 0.0–0.1)
BASOS PCT: 1 % (ref 0–1)
EOS ABS: 0.2 10*3/uL (ref 0.0–0.7)
Eosinophils Relative: 2 % (ref 0–5)
HCT: 38.3 % (ref 36.0–46.0)
HEMOGLOBIN: 13 g/dL (ref 12.0–15.0)
LYMPHS ABS: 1.7 10*3/uL (ref 0.7–4.0)
Lymphocytes Relative: 22 % (ref 12–46)
MCH: 31.5 pg (ref 26.0–34.0)
MCHC: 33.9 g/dL (ref 30.0–36.0)
MCV: 92.7 fL (ref 78.0–100.0)
MONOS PCT: 6 % (ref 3–12)
MPV: 9.7 fL (ref 8.6–12.4)
Monocytes Absolute: 0.5 10*3/uL (ref 0.1–1.0)
NEUTROS ABS: 5.2 10*3/uL (ref 1.7–7.7)
NEUTROS PCT: 69 % (ref 43–77)
PLATELETS: 335 10*3/uL (ref 150–400)
RBC: 4.13 MIL/uL (ref 3.87–5.11)
RDW: 13.5 % (ref 11.5–15.5)
WBC: 7.5 10*3/uL (ref 4.0–10.5)

## 2015-11-24 LAB — COMPREHENSIVE METABOLIC PANEL
ALBUMIN: 4.1 g/dL (ref 3.6–5.1)
ALK PHOS: 44 U/L (ref 33–115)
ALT: 15 U/L (ref 6–29)
AST: 16 U/L (ref 10–30)
BILIRUBIN TOTAL: 0.4 mg/dL (ref 0.2–1.2)
BUN: 8 mg/dL (ref 7–25)
CO2: 25 mmol/L (ref 20–31)
CREATININE: 0.75 mg/dL (ref 0.50–1.10)
Calcium: 9.3 mg/dL (ref 8.6–10.2)
Chloride: 103 mmol/L (ref 98–110)
Glucose, Bld: 73 mg/dL (ref 65–99)
Potassium: 4.1 mmol/L (ref 3.5–5.3)
SODIUM: 141 mmol/L (ref 135–146)
TOTAL PROTEIN: 6.6 g/dL (ref 6.1–8.1)

## 2015-11-24 NOTE — Progress Notes (Signed)
Loveah Like 10-03-1975 [id number]        40 y.o.  [id number]  for annual exam.  Doing well without complaints  Past medical history,surgical history, problem list, medications, allergies, family history and social history were all reviewed and documented as reviewed in the EPIC chart.  ROS:  Performed with pertinent positives and negatives included in the history, assessment and plan.   Additional significant findings :  none   Exam: Kennon Portela assistant Filed Vitals:   11/24/15 1408  BP: 120/64  Height:  (1.676 m)  Weight: 154 lb (69.854 kg)   General appearance:  Normal affect, orientation and appearance. Skin: Grossly normal HEENT: Without gross lesions.  No cervical or supraclavicular adenopathy. Thyroid normal.  Lungs:  Clear without wheezing, rales or rhonchi Cardiac: RR, without RMG Abdominal:  Soft, nontender, without masses, guarding, rebound, organomegaly or hernia Breasts:  Examined lying and sitting without masses, retractions, discharge or axillary adenopathy. Pelvic:  Ext/BUS/vagina normal  Cervix normal  Uterus anteverted, normal size, shape and contour, midline and mobile nontender   Adnexa without masses or tenderness    Anus and perineum normal   Rectovaginal normal sphincter tone without palpated masses or tenderness.    Assessment/Plan:  40 y.o. [id number] female for annual exam with regular menses, vasectomy birth control.   1. History of skipped menses in December. Was given Provera but ultimately started on her own and then has had a subsequent normal menses. She will keep a menstrual calendar as long as regular menses will follow. Recurrent irregular bleeding she knows to represent for further evaluation. 2. Pap smear/HPV 09/2013 negative. No Pap smear done today. No history of significant abnormal Pap smears. 3. Mammography 11/2015. Continue with annual mammography. SBE monthly reviewed. 4. Health maintenance. Baseline CBC, comprehensive metabolic panel,  urinalysis done. Lipid profile last year normal and not repeated this year. Follow up 1 year, sooner as needed.   Dara Lords MD, 2:40 PM 11/24/2015

## 2015-11-24 NOTE — Patient Instructions (Signed)

## 2015-11-25 LAB — URINALYSIS W MICROSCOPIC + REFLEX CULTURE
Bacteria, UA: NONE SEEN [HPF]
Bilirubin Urine: NEGATIVE
CASTS: NONE SEEN [LPF]
CRYSTALS: NONE SEEN [HPF]
Glucose, UA: NEGATIVE
Hgb urine dipstick: NEGATIVE
Ketones, ur: NEGATIVE
Leukocytes, UA: NEGATIVE
Nitrite: NEGATIVE
Protein, ur: NEGATIVE
RBC / HPF: NONE SEEN RBC/HPF (ref <=2)
SPECIFIC GRAVITY, URINE: 1.004 (ref 1.001–1.035)
Squamous Epithelial / LPF: NONE SEEN [HPF] (ref <=5)
WBC, UA: NONE SEEN WBC/HPF (ref <=5)
YEAST: NONE SEEN [HPF]
pH: 7 (ref 5.0–8.0)

## 2016-02-08 ENCOUNTER — Telehealth: Payer: Self-pay | Admitting: *Deleted

## 2016-02-08 MED ORDER — CIPROFLOXACIN HCL 250 MG PO TABS: 250.0000 mg | ORAL_TABLET | Freq: Two times a day (BID) | ORAL | Status: DC

## 2016-02-08 NOTE — Telephone Encounter (Signed)
Okay for ciprofloxacin 250 mg twice a day 3 days 

## 2016-02-08 NOTE — Telephone Encounter (Signed)
Pt called c/o UTI pressure and frequent urination x 1 day, took home UTI kit and it was positive. Pt asked if Rx could be sent to pharmacy? Or would you prefer pt be worked in? Please advise

## 2016-02-08 NOTE — Telephone Encounter (Signed)
Patient informed. Rx sent 

## 2016-03-06 ENCOUNTER — Encounter: Payer: Self-pay | Admitting: Gynecology

## 2016-03-06 ENCOUNTER — Ambulatory Visit (INDEPENDENT_AMBULATORY_CARE_PROVIDER_SITE_OTHER): Payer: BLUE CROSS/BLUE SHIELD | Admitting: Gynecology

## 2016-03-06 VITALS — BP 114/70

## 2016-03-06 DIAGNOSIS — R238 Other skin changes: Secondary | ICD-10-CM

## 2016-03-06 DIAGNOSIS — N926 Irregular menstruation, unspecified: Secondary | ICD-10-CM

## 2016-03-06 NOTE — Progress Notes (Signed)
    Cheryl Webster 10/11/1975 [id number]        40 y.o.  [id number] presents with regular monthly menses until April where she had spotting for about 5 days following her period. She then did not have a menses in May. Has a history of occasional skips in the past. No menopausal symptoms such as hot flashes or night sweats. No skin hair or weight changes. No galactorrhea. Vasectomy birth control.  Also notes a small skin bump on her left chest that she wants me to look at. Recent mammogram this year negative.  Past medical history,surgical history, problem list, medications, allergies, family history and social history were all reviewed and documented in the EPIC chart.  Directed ROS with pertinent positives and negatives documented in the history of present illness/assessment and plan.  Exam: Kennon PortelaKim Gardner assistant Filed Vitals:   03/06/16 1031  BP: 114/70   General appearance:  Normal Both breast examined lying and sitting without masses retractions discharge adenopathy. The area the patient's pointing to is a benign appearing skin papule lateral to her left breast over the chest wall. Pelvic external BUS vagina normal. Cervix normal. Uterus anteverted normal size midline mobile nontender. Adnexa without masses or tenderness.  Assessment/Plan:  40 y.o. [name] with skipped menses. Suspect ovulatory dysfunction. Reviewed with patient. Will monitor over the next week or 2 and if remains without menses will withdrawal with Provera 10 mg twice a day 5 days. If irregular menses continue then we will pursue a more involved evaluation to include FSH TSH prolactin. Check qualitative hCG today for completeness. Also reviewed her benign-appearing skin papule. Recommended observation for now. As long as it remains unchanged and she will follow. If it enlarges or changes at all then we will consider excision. If resumes regular menses then follow up at her annual exam.    [name],Carlise Stofer P MD, 10:46 AM  03/06/2016

## 2016-03-06 NOTE — Patient Instructions (Signed)
Call if you do not start your period in the next week or so.  Call if the skin papule changes on your chest.

## 2016-03-07 LAB — HCG, SERUM, QUALITATIVE: PREG SERUM: NEGATIVE

## 2016-03-18 ENCOUNTER — Telehealth: Payer: Self-pay | Admitting: *Deleted

## 2016-03-18 NOTE — Telephone Encounter (Signed)
Pt called stating no cycle in 2 months, told to wait 2 week if no cycle to call and Rx for provera will be sent to pharmacy. Per note on 03/06/16 "Provera 10 mg twice a day 5 days" will be sent to pharmacy.

## 2016-03-19 MED ORDER — MEDROXYPROGESTERONE ACETATE 10 MG PO TABS: 10.0000 mg | ORAL_TABLET | Freq: Every day | ORAL | Status: DC

## 2016-03-19 MED ORDER — MEDROXYPROGESTERONE ACETATE 10 MG PO TABS: 10.0000 mg | ORAL_TABLET | Freq: Two times a day (BID) | ORAL | Status: DC

## 2016-03-19 NOTE — Telephone Encounter (Signed)
Pt aware, Rx called in 

## 2016-03-19 NOTE — Telephone Encounter (Signed)
OK 

## 2016-03-25 ENCOUNTER — Telehealth: Payer: Self-pay | Admitting: *Deleted

## 2016-03-25 NOTE — Telephone Encounter (Signed)
Pt completed provera 10 mg twice daily x 5 days on Saturday, no cycle yet. Should pt wait x 1 week and then call back if no cycle? Please advise

## 2016-03-26 NOTE — Telephone Encounter (Signed)
Per DPR access detailed message left on pt cell # with the below

## 2016-03-26 NOTE — Telephone Encounter (Signed)
Yes

## 2016-04-01 ENCOUNTER — Encounter: Payer: Self-pay | Admitting: Gynecology

## 2016-04-01 ENCOUNTER — Ambulatory Visit (INDEPENDENT_AMBULATORY_CARE_PROVIDER_SITE_OTHER): Payer: BLUE CROSS/BLUE SHIELD | Admitting: Gynecology

## 2016-04-01 VITALS — BP 118/80 | Ht 66.0 in | Wt 154.0 lb

## 2016-04-01 DIAGNOSIS — N912 Amenorrhea, unspecified: Secondary | ICD-10-CM | POA: Diagnosis not present

## 2016-04-01 LAB — PREGNANCY, URINE: Preg Test, Ur: NEGATIVE

## 2016-04-01 LAB — TSH: TSH: 1.22 m[IU]/L

## 2016-04-01 NOTE — Patient Instructions (Signed)
Follow up for ultrasound as scheduled 

## 2016-04-01 NOTE — Progress Notes (Signed)
    Cheryl Webster 04/12/1976 [id number]        40 y.o.  [id number] with history of regular menses until December when she skipped. She ultimately had a spontaneous menses afterwards and then had regular monthly menses through April and then no bleeding since. Took a Provera withdrawal in June but no bleeding afterwards. No hot flushes, night sweats significant weight changes skin or hair changes. No nipple discharge. Using vasectomy birth control. UPT negative today.  Past medical history,surgical history, problem list, medications, allergies, family history and social history were all reviewed and documented in the EPIC chart.  Directed ROS with pertinent positives and negatives documented in the history of present illness/assessment and plan.  Exam: Bari MantisKim Alexis assistant Filed Vitals:   04/01/16 1544  BP: 118/80  Height: 5\' 6"  (1.676 m)  Weight: 154 lb (69.854 kg)   General appearance:  Normal Abdomen soft nontender without masses guarding rebound Pelvic external BUS vagina normal. Cervix normal. Uterus normal size midline mobile nontender. Adnexa without masses or tenderness.  Assessment/Plan:  40 y.o. [id number] with irregular menses/amenorrhea 2-3 months. Check baseline FSH TSH prolactin hCG and baseline ultrasound for ovarian activity and endometrial echo. Various scenarios discussed to include hypogonadotrophic hypogonadism versus hyper gonadotropic hypogonadism. Will follow up for lab results and ultrasound and then we'll go from there.    [name],Shawn Carattini P MD, 4:02 PM 04/01/2016

## 2016-04-02 LAB — HCG, SERUM, QUALITATIVE: PREG SERUM: NEGATIVE

## 2016-04-02 LAB — PROLACTIN: Prolactin: 8.3 ng/mL

## 2016-04-02 LAB — FOLLICLE STIMULATING HORMONE: FSH: 12.1 m[IU]/mL

## 2016-04-03 ENCOUNTER — Encounter: Payer: Self-pay | Admitting: Gynecology

## 2016-04-03 ENCOUNTER — Ambulatory Visit (INDEPENDENT_AMBULATORY_CARE_PROVIDER_SITE_OTHER): Payer: BLUE CROSS/BLUE SHIELD | Admitting: Gynecology

## 2016-04-03 ENCOUNTER — Ambulatory Visit (INDEPENDENT_AMBULATORY_CARE_PROVIDER_SITE_OTHER): Payer: BLUE CROSS/BLUE SHIELD

## 2016-04-03 VITALS — BP 126/82 | Ht 66.0 in | Wt 154.0 lb

## 2016-04-03 DIAGNOSIS — N926 Irregular menstruation, unspecified: Secondary | ICD-10-CM

## 2016-04-03 DIAGNOSIS — N912 Amenorrhea, unspecified: Secondary | ICD-10-CM

## 2016-04-03 NOTE — Patient Instructions (Signed)
Call if menstrual irregularity continues.

## 2016-04-03 NOTE — Progress Notes (Signed)
    Cheryl Webster 07/01/1976 [id number]        40 y.o.  [id number] with history of irregular menses until December where she skipped. Started afterwards and had monthly menses until April and then no menses after. Took a Provera withdrawal in June but no bleeding afterwards. No symptoms of hot flushes night sweats weight changes or hair changes. No nipple discharge. Vasectomy birth control. Recent blood work includes a negative hCG, TSH, prolactin and FSH. Presents now for ultrasound. Notes during the ultrasound she started having some bleeding.  Past medical history,surgical history, problem list, medications, allergies, family history and social history were all reviewed and documented in the EPIC chart.  Directed ROS with pertinent positives and negatives documented in the history of present illness/assessment and plan.  Exam: Filed Vitals:   04/03/16 1232  BP: 126/82  Height: 5\' 6"  (1.676 m)  Weight: 154 lb (69.854 kg)   General appearance:  Normal  Ultrasound shows uterus normal size and echotexture. Endometrial echo 4.9 mm. Right and left ovaries normal. Cul-de-sac negative.  Assessment/Plan:  40 y.o. [id number] with irregular menses. Lab work and ultrasound normal. Appears to have started menses today. Will watch for now. If resumes regular menses them will follow. If continues with amenorrhea then consider another Provera withdrawal or possible low-dose oral contraceptives for short-term regulations such as 6 months. Reviewed risks of oral contraceptives to include increased risk of thrombosis such as stroke heart attack DVT. Never smoked and not being followed for any significant medical issues. Will call me if she does not resume regular menses in one month or so.      [name],Jozsef Wescoat P MD, 12:47 PM 04/03/2016

## 2016-05-13 ENCOUNTER — Telehealth: Payer: Self-pay

## 2016-05-13 MED ORDER — ALPRAZOLAM 0.5 MG PO TABS
0.5000 mg | ORAL_TABLET | Freq: Four times a day (QID) | ORAL | 1 refills | Status: DC | PRN
Start: 2016-05-13 — End: 2018-12-18

## 2016-05-13 NOTE — Telephone Encounter (Signed)
Okay for Xanax 0.5 mg #20 one by mouth every 6 hours as needed with one refill

## 2016-05-13 NOTE — Telephone Encounter (Signed)
Rx sent and patient informed.  

## 2016-05-13 NOTE — Telephone Encounter (Signed)
Patient called because in the past (2014) you prescribed Xanax for her. She said she only uses in emergency and it is for anxiety associated with driving so she keeps them in her car. She said she just realized that the Rx she has has expired and would like another to have on hand.

## 2016-11-26 ENCOUNTER — Ambulatory Visit (INDEPENDENT_AMBULATORY_CARE_PROVIDER_SITE_OTHER): Payer: BLUE CROSS/BLUE SHIELD | Admitting: Gynecology

## 2016-11-26 ENCOUNTER — Other Ambulatory Visit: Payer: Self-pay | Admitting: Gynecology

## 2016-11-26 ENCOUNTER — Encounter: Payer: Self-pay | Admitting: Gynecology

## 2016-11-26 VITALS — BP 120/74 | Ht 66.0 in | Wt 161.0 lb

## 2016-11-26 DIAGNOSIS — R5383 Other fatigue: Secondary | ICD-10-CM | POA: Diagnosis not present

## 2016-11-26 DIAGNOSIS — Z01419 Encounter for gynecological examination (general) (routine) without abnormal findings: Secondary | ICD-10-CM

## 2016-11-26 DIAGNOSIS — Z1231 Encounter for screening mammogram for malignant neoplasm of breast: Secondary | ICD-10-CM

## 2016-11-26 LAB — CBC WITH DIFFERENTIAL/PLATELET
BASOS ABS: 54 {cells}/uL (ref 0–200)
BASOS PCT: 1 %
EOS ABS: 108 {cells}/uL (ref 15–500)
Eosinophils Relative: 2 %
HCT: 40.1 % (ref 35.0–45.0)
HEMOGLOBIN: 13.5 g/dL (ref 11.7–15.5)
LYMPHS ABS: 1890 {cells}/uL (ref 850–3900)
Lymphocytes Relative: 35 %
MCH: 31.2 pg (ref 27.0–33.0)
MCHC: 33.7 g/dL (ref 32.0–36.0)
MCV: 92.6 fL (ref 80.0–100.0)
MPV: 9.8 fL (ref 7.5–12.5)
Monocytes Absolute: 270 cells/uL (ref 200–950)
Monocytes Relative: 5 %
Neutro Abs: 3078 cells/uL (ref 1500–7800)
Neutrophils Relative %: 57 %
Platelets: 369 10*3/uL (ref 140–400)
RBC: 4.33 MIL/uL (ref 3.80–5.10)
RDW: 13.3 % (ref 11.0–15.0)
WBC: 5.4 10*3/uL (ref 3.8–10.8)

## 2016-11-26 LAB — COMPREHENSIVE METABOLIC PANEL
ALBUMIN: 4.1 g/dL (ref 3.6–5.1)
ALK PHOS: 44 U/L (ref 33–115)
ALT: 12 U/L (ref 6–29)
AST: 16 U/L (ref 10–30)
BUN: 9 mg/dL (ref 7–25)
CHLORIDE: 104 mmol/L (ref 98–110)
CO2: 26 mmol/L (ref 20–31)
Calcium: 9.5 mg/dL (ref 8.6–10.2)
Creat: 0.84 mg/dL (ref 0.50–1.10)
Glucose, Bld: 85 mg/dL (ref 65–99)
POTASSIUM: 4.3 mmol/L (ref 3.5–5.3)
SODIUM: 139 mmol/L (ref 135–146)
TOTAL PROTEIN: 7 g/dL (ref 6.1–8.1)
Total Bilirubin: 0.5 mg/dL (ref 0.2–1.2)

## 2016-11-26 NOTE — Patient Instructions (Signed)

## 2016-11-26 NOTE — Progress Notes (Signed)
    Cheryl Webster 01/14/1976 [id number]        41 y.o.  [id number] for annual exam.  Also complaining of fatigue and some dizziness whenever she is exercising. No weight changes such as weight loss or gain. No skin or hair changes. No nausea vomiting diarrhea constipation.  Past medical history,surgical history, problem list, medications, allergies, family history and social history were all reviewed and documented as reviewed in the EPIC chart.  ROS:  Performed with pertinent positives and negatives included in the history, assessment and plan.   Additional significant findings :  none   Exam: Kennon PortelaKim Gardner assistant Vitals:   11/26/16 1358  BP: 120/74  Weight: 161 lb (73 kg)  Height: 5\' 6"  (1.676 m)   Body mass index is 25.99 kg/m.  General appearance:  Normal affect, orientation and appearance. Skin: Grossly normal HEENT: Without gross lesions.  No cervical or supraclavicular adenopathy. Thyroid normal.  Lungs:  Clear without wheezing, rales or rhonchi Cardiac: RR, without RMG Abdominal:  Soft, nontender, without masses, guarding, rebound, organomegaly or hernia Breasts:  Examined lying and sitting without masses, retractions, discharge or axillary adenopathy. Pelvic:  Ext, BUS, Vagina: Normal  Cervix: Normal  Uterus: Anteverted, normal size, shape and contour, midline and mobile nontender   Adnexa: Without masses or tenderness    Anus and perineum: Normal   Rectovaginal: Normal sphincter tone without palpated masses or tenderness.    Assessment/Plan:  41 y.o. [id number] female for annual exam with regular menses, vasectomy birth control.   1. Fatigue and dizziness with exercise. No other localizing symptoms. Will check baseline CBC, CMP, TSH and urinalysis. Recommended hydrating before exercising and eating something to stabilize her glucose. If symptoms would persist and recommended follow up with primary physician. 2. Mammography 11/2015. Recommend scheduling a screening  mammogram now and she agrees to call do so. SBE monthly reviewed. 3. Pap smear/HPV 09/2013. No Pap smear done today. No history of significant abnormal Pap smears. Plan repeat Pap smear at 5 year interval per current screening guidelines. 4. Health maintenance. Lipid profile excellent 2 years ago. Not repeated now. Follow up in one year, sooner as needed.   [name],Dorismar Chay P MD, 3:24 PM 11/26/2016

## 2016-11-27 LAB — TSH: TSH: 1.09 m[IU]/L

## 2016-11-27 LAB — URINALYSIS W MICROSCOPIC + REFLEX CULTURE
BILIRUBIN URINE: NEGATIVE
Bacteria, UA: NONE SEEN [HPF]
CASTS: NONE SEEN [LPF]
Crystals: NONE SEEN [HPF]
Glucose, UA: NEGATIVE
Ketones, ur: NEGATIVE
Leukocytes, UA: NEGATIVE
Nitrite: NEGATIVE
PROTEIN: NEGATIVE
RBC / HPF: NONE SEEN RBC/HPF (ref <=2)
SPECIFIC GRAVITY, URINE: 1.009 (ref 1.001–1.035)
Squamous Epithelial / LPF: NONE SEEN [HPF] (ref <=5)
WBC UA: NONE SEEN WBC/HPF (ref <=5)
YEAST: NONE SEEN [HPF]
pH: 7.5 (ref 5.0–8.0)

## 2016-12-12 ENCOUNTER — Ambulatory Visit
Admission: RE | Admit: 2016-12-12 | Discharge: 2016-12-12 | Disposition: A | Discharge status: Home / Self Care | Payer: BLUE CROSS/BLUE SHIELD | Source: Ambulatory Visit | Attending: Gynecology | Admitting: Gynecology

## 2016-12-12 DIAGNOSIS — Z1231 Encounter for screening mammogram for malignant neoplasm of breast: Secondary | ICD-10-CM

## 2017-03-05 ENCOUNTER — Ambulatory Visit (INDEPENDENT_AMBULATORY_CARE_PROVIDER_SITE_OTHER): Payer: BLUE CROSS/BLUE SHIELD | Admitting: Women's Health

## 2017-03-05 ENCOUNTER — Encounter: Payer: Self-pay | Admitting: Women's Health

## 2017-03-05 VITALS — BP 122/80 | Ht 66.0 in | Wt 161.0 lb

## 2017-03-05 DIAGNOSIS — N912 Amenorrhea, unspecified: Secondary | ICD-10-CM

## 2017-03-05 MED ORDER — MEDROXYPROGESTERONE ACETATE 10 MG PO TABS: 10.0000 mg | ORAL_TABLET | Freq: Every day | ORAL | 0 refills | Status: DC

## 2017-03-05 NOTE — Patient Instructions (Signed)
Secondary Amenorrhea Secondary amenorrhea is the stopping of menstrual flow for 3-6 months in a female who has previously had periods. There are many possible causes. Most of these causes are not serious. Usually, treating the underlying problem causing the loss of menses will return your periods to normal. What are the causes? Some common and uncommon causes of not menstruating include:  Malnutrition.  Low blood sugar (hypoglycemia).  Polycystic ovary disease.  Stress or fear.  Breastfeeding.  Hormone imbalance.  Ovarian failure.  Medicines.  Extreme obesity.  Cystic fibrosis.  Low body weight or drastic weight reduction from any cause.  Early menopause.  Removal of ovaries or uterus.  Contraceptives.  Illness.  Long-term (chronic) illnesses.  Cushing syndrome.  Thyroid problems.  Birth control pills, patches, or vaginal rings for birth control.  What increases the risk? You may be at greater risk of secondary amenorrhea if:  You have a family history of this condition.  You have an eating disorder.  You do athletic training.  How is this diagnosed? A diagnosis is made by your health care provider taking a medical history and doing a physical exam. This will include a pelvic exam to check for problems with your reproductive organs. Pregnancy must be ruled out. Often, numerous blood tests are done to measure different hormones in the body. Urine testing may be done. Specialized exams (ultrasound, CT scan, MRI, or hysteroscopy) may have to be done as well as measuring the body mass index (BMI). How is this treated? Treatment depends on the cause of the amenorrhea. If an eating disorder is present, this can be treated with an adequate diet and therapy. Chronic illnesses may improve with treatment of the illness. Amenorrhea may be corrected with medicines, lifestyle changes, or surgery. If the amenorrhea cannot be corrected, it is sometimes possible to create a  false menstruation with medicines. Follow these instructions at home:  Maintain a healthy diet.  Manage weight problems.  Exercise regularly but not excessively.  Get adequate sleep.  Manage stress.  Be aware of changes in your menstrual cycle. Keep a record of when your periods occur. Note the date your period starts, how long it lasts, and any problems. Contact a health care provider if: Your symptoms do not get better with treatment. This information is not intended to replace advice given to you by your health care provider. Make sure you discuss any questions you have with your health care provider. Document Released: 10/28/2006 Document Revised: 02/22/2016 Document Reviewed: 03/04/2013 Elsevier Interactive Patient Education  2018 [employer]  

## 2017-03-05 NOTE — Progress Notes (Signed)
Presents with amenorrhea for 2-1/2 months. Monthly cycles until March, husband vasectomy. Denies menopausal symptoms of hot flashes, vaginal dryness, mood changes. States last month did have a week of breast tenderness and felt like cycle was going to start but did not. Has a 41 year old daughter is having mostly monthly cycles. Denies vaginal discharge, urinary symptoms, abdominal pain. Has had some irregular cycles in the past with periods of amenorrhea with a normal TSH and prolactin. Denies any change in routine, stress levels or weight.  Exam: Appears well. Abdomen soft, nontender, external genitalia within normal limits, speculum exam no visible discharge or erythema, bimanual no CMT or adnexal tenderness or fullness noted.  Secondary amenorrhea  Plan: FSH, qualitative hCG. If both negative withdrawal with Provera 10 mg by mouth daily for 5 days. Instructed to call if no cycle after Provera or if cycles space greater than 8 weeks.

## 2017-03-06 ENCOUNTER — Telehealth: Payer: Self-pay

## 2017-03-06 LAB — HCG, SERUM, QUALITATIVE: Preg, Serum: NEGATIVE

## 2017-03-06 LAB — FOLLICLE STIMULATING HORMONE: FSH: 24.9 m[IU]/mL

## 2017-03-06 NOTE — Telephone Encounter (Signed)
-----   Message from Harrington ChallengerNancy J Young, NP sent at 03/06/2017 11:53 AM EDT ----- Please call and review FSH is elevated, low menopause range.  Review def of menopause is no cycle for 1 year. No need to take med-provera which I gave her yesterday pending today's results. Review may have another cycle or may never have another.  Keep menstrual record and review with Dr Audie BoxFontaine at annual.  Important to continue to exercise, good for bone health. Let me know if she has any questions.  She was not having many symptoms yesterday other than amenorrhea.

## 2017-03-06 NOTE — Telephone Encounter (Signed)
Patient was read your note regarding FSH level.  She had several questions:  #1 "Anyway to delay menopause because it is about 10 years too early?"  #2  What are the risks for early menopause?

## 2017-03-07 NOTE — Telephone Encounter (Signed)
Great subject to discuss that office visit

## 2017-03-07 NOTE — Telephone Encounter (Signed)
TC reviewed menopause, may get a cycle or two, FSH 24, having no menopausal symptoms at this time, mother was later 7540's with menopause.  HRT reviewed, risks and benefits, will watch at this time. Reviewed importance of bone health,  VIT d 2000 daily, exercise and calcium rich diet.  Will review with Dr Audie BoxFontaine at annual exam

## 2017-03-19 ENCOUNTER — Telehealth: Payer: Self-pay

## 2017-03-19 NOTE — Telephone Encounter (Signed)
Patient said when she saw you back in Feb for CE she was having headaches, dizziness and fatigue. She, at that time, thought it might be related to allergies. However, she said it persists even now and she had tried numerous allergy meds with no relief.  She wants your recommendation how to proceed. She questions does she need referral to ENT? Allergist? Could it be her recent diagnosis of menopause by [place] causing symptoms?

## 2017-03-19 NOTE — Telephone Encounter (Signed)
I read patient what Dr. Velvet BatheF wrote and she is fine with scheduling appt. Debarah CrapeClaudia will call her back to schedule.

## 2017-03-19 NOTE — Telephone Encounter (Signed)
It is possible that menopause can sometimes cause unusual symptoms. Thyroid dysfunction and other causes could also contribute though. Recommend office visit to evaluate and then discuss.

## 2017-03-21 ENCOUNTER — Encounter: Payer: Self-pay | Admitting: Gynecology

## 2017-03-21 ENCOUNTER — Ambulatory Visit (INDEPENDENT_AMBULATORY_CARE_PROVIDER_SITE_OTHER): Payer: BLUE CROSS/BLUE SHIELD | Admitting: Gynecology

## 2017-03-21 VITALS — BP 114/76

## 2017-03-21 DIAGNOSIS — R42 Dizziness and giddiness: Secondary | ICD-10-CM | POA: Diagnosis not present

## 2017-03-21 DIAGNOSIS — J329 Chronic sinusitis, unspecified: Secondary | ICD-10-CM

## 2017-03-21 DIAGNOSIS — N926 Irregular menstruation, unspecified: Secondary | ICD-10-CM | POA: Diagnosis not present

## 2017-03-21 MED ORDER — AZITHROMYCIN 250 MG PO TABS: ORAL_TABLET | ORAL | 0 refills | Status: DC

## 2017-03-21 NOTE — Patient Instructions (Signed)
Take the Z-Pak antibiotics 2 pills the first day and then 1 pill daily afterwards. Call if your symptoms persist or recur and we will refer you to ENT.

## 2017-03-21 NOTE — Progress Notes (Signed)
    Theone Murdochllison Shon 10/22/1975 [id number]        41 y.o.  [id number] presents complaining of dizziness and headaches. Has been going on for months where she will feel the room spinning. Also has headaches that occur periorbitally and in the occipital region.  Notes on and off ear pain mostly on the right. No fever or chills. No significant nasal drainage/sore throat. Had been treated for sinusitis several months ago. Questions whether related to her irregular menses. Recently had lab work after skipping 2 months of menses with borderline FSH at 24. Recent TSH normal in February. HCG negative.  Past medical history,surgical history, problem list, medications, allergies, family history and social history were all reviewed and documented in the EPIC chart.  Directed ROS with pertinent positives and negatives documented in the history of present illness/assessment and plan.  Exam: Vitals:   03/21/17 0830  BP: 114/76   General appearance:  Normal HEENT normal. Tympanic membranes with normal light reflex. No cervical adenopathy or pharyngitis. No periorbital discomfort. Lungs clear Cardiac regular rate no rubs murmurs or gallops.   Assessment/Plan:  41 y.o. [id number] with above symptoms and normal exam. Will cover with Z-Pak for sinusitis, subacute. Differential to include ENT, neuro, cardiac all reviewed. She has no other associated symptoms such as shortness of breath or chest pain. No racing heart. If symptoms resolve them will monitor. If they transiently resolve but recur or persist despite antibiotic she will call and we will start with ENT evaluation. She will keep menstrual calendar as she did do a Provera withdrawal with bleeding starting now. If remains without menses over the next 2 months she will call. The issues of premature menopause and risks of hypoestrogenism at a young age discussed. Need for HRT reviewed. If resumes regular menses them will follow.    [name],Baruc Tugwell P MD, 8:55 AM  03/21/2017

## 2017-03-24 ENCOUNTER — Telehealth: Payer: Self-pay

## 2017-03-24 NOTE — Telephone Encounter (Signed)
Patient said she saw you Friday and you prescribed antibiotic but "not helping as I am still dizzy". Wants referral to ENT (someone you think she "will be a good match with") asap.

## 2017-03-24 NOTE — Telephone Encounter (Signed)
[facility]. Any of the providers are good

## 2017-03-24 NOTE — Telephone Encounter (Signed)
Routed to GuthrieJennifer to handle referral. And schedule.

## 2017-03-26 NOTE — Telephone Encounter (Signed)
I called [facility] and spoke with Shanda BumpsJessica was told to fax notes to [phone], they will contact pt to schedule. And fax me back with time and date.

## 2017-04-16 NOTE — Telephone Encounter (Signed)
Pt scheduled on 05/05/17, informed by [facility]

## 2017-05-23 ENCOUNTER — Telehealth: Payer: Self-pay | Admitting: *Deleted

## 2017-05-23 NOTE — Telephone Encounter (Signed)
(  pt aware you are out of the office) Pt called to follow up with OV on 03/21/17 states she had cycle with provera in June then 6 weeks later a cycle at the end of July and no cycle in August yet. Pt called asking if she could start on birth control pills to help regulate cycle states you told her to call. States ENT thought maybe this will help with headaches as well. Please advise

## 2017-05-23 NOTE — Telephone Encounter (Signed)
Pt left message to call regarding cycle, I called pt back and received her VM asked her to call me.

## 2017-05-25 NOTE — Telephone Encounter (Signed)
Ok to start Loestrin 1/20 equivalent after provera 10 mg x 10 day withdrawal and negative UPT  before provera.  Refill pills through next annual.

## 2017-05-26 MED ORDER — NORETHIN ACE-ETH ESTRAD-FE 1-20 MG-MCG PO TABS: 1.0000 | ORAL_TABLET | Freq: Every day | ORAL | 3 refills | Status: DC

## 2017-05-26 MED ORDER — MEDROXYPROGESTERONE ACETATE 10 MG PO TABS: 10.0000 mg | ORAL_TABLET | Freq: Every day | ORAL | 0 refills | Status: DC

## 2017-05-26 NOTE — Telephone Encounter (Signed)
Left a detailed message on pt voicemail per DPR access with the below telling pt to take UPT 1st before provera. Rx sent for both.

## 2017-05-31 ENCOUNTER — Telehealth: Payer: Self-pay | Admitting: Obstetrics and Gynecology

## 2017-05-31 NOTE — Telephone Encounter (Signed)
After hours phone call from patient.  Having skipped menstrual cycles.  Dr. Audie BoxFontaine prescribed Provera and then Loestrin 1/20.  Has not completed the course of Provera and has already started her menstrual cycle.  Moderate flow.  Ok not to complete the ten days of Provera and start Loestrin 1/20 tomorrow, Sunday.

## 2017-09-15 ENCOUNTER — Telehealth: Payer: Self-pay | Admitting: *Deleted

## 2017-09-15 NOTE — Telephone Encounter (Signed)
Patient called left message c/o breakthough bleeding with birth control pills and right side discomfort, I called pt back and left message to schedule OV with provider at next available appointment.

## 2017-09-17 ENCOUNTER — Ambulatory Visit: Payer: BLUE CROSS/BLUE SHIELD | Admitting: Gynecology

## 2017-09-17 ENCOUNTER — Encounter: Payer: Self-pay | Admitting: Gynecology

## 2017-09-17 VITALS — BP 118/76

## 2017-09-17 DIAGNOSIS — N926 Irregular menstruation, unspecified: Secondary | ICD-10-CM | POA: Diagnosis not present

## 2017-09-17 MED ORDER — MEDROXYPROGESTERONE ACETATE 10 MG PO TABS: 10.0000 mg | ORAL_TABLET | Freq: Every day | ORAL | 3 refills | Status: DC

## 2017-09-17 NOTE — Progress Notes (Signed)
    Cheryl Webster 05/06/1976 [id number]    41 y.o.  [id number] presents with 2 issues with:  1. Irregular menses.  Notes that her menses have always been irregular.  Has had issues controlling on with oral contraceptives to include use of patch in the past.  Had used Mirena IUD with subsequent amenorrhea.  Now with skips by several months at a time.  Had marginal FSH this past year at 4324.  Was started on Loestrin 1/20 equivalent several months ago.  Has had irregular bleeding since where she will have breakthrough bleeding throughout the month. 2. Migraine headaches.  Patient has a long history of migraine headaches.  Has seen neurology and ENT.  Has tried medications to include Imitrex in the past with side effects such that she discontinued them.  Now is having sporadic headaches seeming to correlate with her menses.  Described as dull aching lasts a day or so.  No visual changes or auras.  Was told by neurologist they thought that these were hormonally related.   Past medical history,surgical history, problem list, medications, allergies, family history and social history were all reviewed and documented in the EPIC chart.  Directed ROS with pertinent positives and negatives documented in the history of present illness/assessment and plan.  Exam: Kennon PortelaKim Gardner assistant Vitals:   09/17/17 1530  BP: 118/76   General appearance:  Normal Abdomen soft nontender without masses guarding rebound Pelvic external BUS vagina normal.  Cervix normal.  Uterus normal size midline mobile nontender.  Adnexa without masses or tenderness.  Assessment/Plan:  41 y.o. [id number] with:  1. Irregular menses.  Sounds historically to be an ovulatory irregularity.  Marginal FSH raises possibility of early menopause.  Is having less frequent menses with no prolonged or atypical bleeding.  Discussed differential to include structural such as polyps or myomas although would expect more prolonged bleeding instead of less  frequent.  At this point I recommend stopping her birth control pills monitoring her cycles and using intermittent progesterone withdrawal if it goes without menses every 6 weeks.  Will use Provera 10 mg x 10 days.  Using vasectomy birth control.  If regular menses when they are withdrawn and no intermenstrual bleeding then will maintain this for now.  If irregular bleeding or prolonged bleeding then will proceed with sonohysterogram.  Discussed alternatives to include higher dose birth control pill.  Possible relationship of birth control pills stroke and migraine headaches also discussed.  Possible use of Mirena IUD for menstrual suppression also reviewed. 2. Migraine headaches.  Options for management to include low-dose estrogen replacement, migraine headache medications such as Imitrex trial again.  Also discussed monitoring and seeing which she does want to stop the oral contraceptives.  At this point we will go ahead and do this she will keep a headache history.  She will call if she continues to have migraine headaches.  Greater than 50% of my time was spent in direct face to face counseling and coordination of care with the patient.     Dara Lordsimothy P Lakeasha Petion MD, 4:07 PM 09/17/2017

## 2017-09-17 NOTE — Patient Instructions (Addendum)
Stop the birth control pills as we discussed.  Use the progesterone have a period by 6 weeks.  Call if your migraine headaches continue.  Call if irregular bleeding continues.

## 2017-10-10 ENCOUNTER — Other Ambulatory Visit: Payer: Self-pay | Admitting: Family Medicine

## 2017-10-10 ENCOUNTER — Ambulatory Visit
Admission: RE | Admit: 2017-10-10 | Discharge: 2017-10-10 | Disposition: A | Discharge status: Home / Self Care | Payer: BLUE CROSS/BLUE SHIELD | Source: Ambulatory Visit | Attending: Family Medicine | Admitting: Family Medicine

## 2017-10-10 DIAGNOSIS — J189 Pneumonia, unspecified organism: Secondary | ICD-10-CM

## 2017-10-10 DIAGNOSIS — J690 Pneumonitis due to inhalation of food and vomit: Secondary | ICD-10-CM

## 2017-11-25 ENCOUNTER — Other Ambulatory Visit: Payer: Self-pay | Admitting: Gynecology

## 2017-11-25 DIAGNOSIS — Z139 Encounter for screening, unspecified: Secondary | ICD-10-CM

## 2017-11-27 ENCOUNTER — Ambulatory Visit (INDEPENDENT_AMBULATORY_CARE_PROVIDER_SITE_OTHER): Payer: BLUE CROSS/BLUE SHIELD | Admitting: Gynecology

## 2017-11-27 ENCOUNTER — Encounter: Payer: Self-pay | Admitting: Gynecology

## 2017-11-27 VITALS — BP 130/80 | Ht 66.5 in | Wt 163.0 lb

## 2017-11-27 DIAGNOSIS — Z01419 Encounter for gynecological examination (general) (routine) without abnormal findings: Secondary | ICD-10-CM | POA: Diagnosis not present

## 2017-11-27 DIAGNOSIS — G43809 Other migraine, not intractable, without status migrainosus: Secondary | ICD-10-CM

## 2017-11-27 DIAGNOSIS — Z13220 Encounter for screening for lipoid disorders: Secondary | ICD-10-CM | POA: Diagnosis not present

## 2017-11-27 MED ORDER — NORETHIN ACE-ETH ESTRAD-FE 1-20 MG-MCG PO TABS: 1.0000 | ORAL_TABLET | Freq: Every day | ORAL | 4 refills | Status: DC

## 2017-11-27 NOTE — Patient Instructions (Signed)
Follow-up if any issues after starting the new continuous birth control pill regiment  Follow-up in 1 year for annual exam

## 2017-11-27 NOTE — Progress Notes (Signed)
    Cheryl Webster 08/09/1976 [id number]        42 y.o.  [id number] for annual gynecologic exam.  Still having issues with her migraine headaches and irregular menses.  Past medical history,surgical history, problem list, medications, allergies, family history and social history were all reviewed and documented as reviewed in the EPIC chart.  ROS:  Performed with pertinent positives and negatives included in the history, assessment and plan.   Additional significant findings : None   Exam: Kennon PortelaKim Gardner assistant Vitals:   11/27/17 1028  BP: 130/80  Weight: 163 lb (73.9 kg)  Height: 5' 6.5" (1.689 m)   Body mass index is 25.91 kg/m.  General appearance:  Normal affect, orientation and appearance. Skin: Grossly normal HEENT: Without gross lesions.  No cervical or supraclavicular adenopathy. Thyroid normal.  Lungs:  Clear without wheezing, rales or rhonchi Cardiac: RR, without RMG Abdominal:  Soft, nontender, without masses, guarding, rebound, organomegaly or hernia Breasts:  Examined lying and sitting without masses, retractions, discharge or axillary adenopathy. Pelvic:  Ext, BUS, Vagina: Normal  Cervix: Normal  Uterus: Anteverted, normal size, shape and contour, midline and mobile nontender   Adnexa: Without masses or tenderness    Anus and perineum: Normal   Rectovaginal: Normal sphincter tone without palpated masses or tenderness.    Assessment/Plan:  42 y.o. [name] female for annual gynecologic exam with irregular menses, vasectomy birth control.   1. Irregular menses/migraine headaches.  Patient stopped her birth control pills and had irregular bleeding.  We did a progesterone withdrawal but she ultimately started back on her pills more from a headache standpoint as this seems to relieve her headaches.  We reviewed strategies both from the irregular menses standpoint which she apparently has had an issue with in the past before starting on oral contraceptives.  A recent FSH  was 24.9 this past year.  Question as to whether she is in the perimenopause also.  I reviewed various options to include stopping everything and just using a intermittent progesterone if she goes more than 2 months without menses and follow-up with her neurologist in reference to migraine management.  Trial of low-dose continuous estrogen as it seems her headaches are more triggered with her menses or any bleeding.  Use a estrogen patch and then intermittent progesterone withdrawal monthly.  Alternatives also include continuous oral contraceptives and avoid menses to see if this does not help with her headaches.  Risks of oral contraceptives particular with migraines discussed to include thrombosis such as stroke heart attack DVT.  She is otherwise healthy and never smoked.  At this point the patient's interested in continuous 1/20 oral contraceptives.  She understands clearly and accepts the risks.  She will [facility] where she has [facility] care.  She will follow-up with me if she has issues after starting the continuous oral contraceptives. 2. Mammography scheduled and she will follow-up for this.  Breast exam normal today. 3. Pap smear/HPV 09/2013.  Pap smear/HPV today at 4+ year interval.  No history of abnormal Pap smears.  We will continue with every 5-year screening. 4. Health maintenance.  Patient requests baseline labs.  CBC, CMP, lipid profile ordered.  Follow-up in 1 year, sooner as needed.   Dara Lordsimothy P Jasun Gasparini MD, 10:54 AM 11/27/2017

## 2017-11-27 NOTE — Addendum Note (Signed)
Addended by: Dayna BarkerGARDNER, KIMBERLY K on: 11/27/2017 12:08 PM   Modules accepted: Orders

## 2017-11-28 DIAGNOSIS — R87610 Atypical squamous cells of undetermined significance on cytologic smear of cervix (ASC-US): Secondary | ICD-10-CM

## 2017-11-28 HISTORY — DX: Atypical squamous cells of undetermined significance on cytologic smear of cervix (ASC-US): R87.610

## 2017-11-28 LAB — LIPID PANEL
CHOLESTEROL: 178 mg/dL (ref <200)
HDL: 57 mg/dL (ref >50)
LDL Cholesterol (Calc): 104 mg/dL (calc) — ABNORMAL HIGH
Non-HDL Cholesterol (Calc): 121 mg/dL (calc) (ref <130)
Total CHOL/HDL Ratio: 3.1 (calc) (ref <5.0)
Triglycerides: 82 mg/dL (ref <150)

## 2017-11-28 LAB — CBC WITH DIFFERENTIAL/PLATELET
BASOS ABS: 41 {cells}/uL (ref 0–200)
Basophils Relative: 0.7 %
EOS PCT: 2 %
Eosinophils Absolute: 118 cells/uL (ref 15–500)
HEMATOCRIT: 37.8 % (ref 35.0–45.0)
HEMOGLOBIN: 13.1 g/dL (ref 11.7–15.5)
LYMPHS ABS: 1564 {cells}/uL (ref 850–3900)
MCH: 31.9 pg (ref 27.0–33.0)
MCHC: 34.7 g/dL (ref 32.0–36.0)
MCV: 92 fL (ref 80.0–100.0)
MONOS PCT: 6.6 %
MPV: 10.4 fL (ref 7.5–12.5)
NEUTROS ABS: 3788 {cells}/uL (ref 1500–7800)
Neutrophils Relative %: 64.2 %
Platelets: 362 10*3/uL (ref 140–400)
RBC: 4.11 10*6/uL (ref 3.80–5.10)
RDW: 12.3 % (ref 11.0–15.0)
Total Lymphocyte: 26.5 %
WBC mixed population: 389 cells/uL (ref 200–950)
WBC: 5.9 10*3/uL (ref 3.8–10.8)

## 2017-11-28 LAB — COMPREHENSIVE METABOLIC PANEL
AG RATIO: 1.8 (calc) (ref 1.0–2.5)
ALKALINE PHOSPHATASE (APISO): 31 U/L — AB (ref 33–115)
ALT: 10 U/L (ref 6–29)
AST: 14 U/L (ref 10–30)
Albumin: 4.4 g/dL (ref 3.6–5.1)
BILIRUBIN TOTAL: 0.5 mg/dL (ref 0.2–1.2)
BUN: 8 mg/dL (ref 7–25)
CHLORIDE: 104 mmol/L (ref 98–110)
CO2: 24 mmol/L (ref 20–32)
Calcium: 9.8 mg/dL (ref 8.6–10.2)
Creat: 0.97 mg/dL (ref 0.50–1.10)
GLOBULIN: 2.5 g/dL (ref 1.9–3.7)
Glucose, Bld: 76 mg/dL (ref 65–99)
Potassium: 4.2 mmol/L (ref 3.5–5.3)
Sodium: 136 mmol/L (ref 135–146)
Total Protein: 6.9 g/dL (ref 6.1–8.1)

## 2017-12-02 ENCOUNTER — Telehealth: Payer: Self-pay

## 2017-12-02 ENCOUNTER — Other Ambulatory Visit: Payer: Self-pay | Admitting: Gynecology

## 2017-12-02 LAB — PAP IG AND HPV HIGH-RISK: HPV DNA HIGH RISK: NOT DETECTED

## 2017-12-02 MED ORDER — NORETHIN ACE-ETH ESTRAD-FE 1-20 MG-MCG PO TABS: ORAL_TABLET | ORAL | 4 refills | Status: DC

## 2017-12-02 NOTE — Telephone Encounter (Signed)
Note from pharmacy regarding generic Loestrin 1/20 prescription.  "Patinet requests new Rx. Please send new Rx for birth control without iron tabs. Patient states that new Rx was supposed to be for continuous use, but were sent as 28 day packs."  Per Dr. Kristie CowmanF's office note it does confirm he planned for her to take active pills continuously. Rx was resent to reflect that.

## 2017-12-03 ENCOUNTER — Encounter: Payer: Self-pay | Admitting: Gynecology

## 2017-12-04 ENCOUNTER — Telehealth: Payer: Self-pay

## 2017-12-04 NOTE — Telephone Encounter (Signed)
I saw them.  Low alkaline phosphatase does not mean anything.  LDL of 104 is minimally elevated and with good HDL that negates it

## 2017-12-04 NOTE — Telephone Encounter (Signed)
Patient informed. 

## 2017-12-04 NOTE — Telephone Encounter (Signed)
I called her with PAP result. She asked about her blood work results. There was an slightly elev LDL on LP and a slightly low Alk Phos on CMET. Otherwise normal. Just wanted to be sure you had reviewed those labs and if anything to recommend>

## 2017-12-08 ENCOUNTER — Telehealth: Payer: Self-pay | Admitting: *Deleted

## 2017-12-08 MED ORDER — NORETHINDRONE ACET-ETHINYL EST 1-20 MG-MCG PO TABS: ORAL_TABLET | ORAL | 4 refills | Status: DC

## 2017-12-08 NOTE — Telephone Encounter (Signed)
Patient called stating she needs Rx for Loestrin 1/20 without iron tablets to take continuously. On OV 11/27/17 Junel FE 1/20 was prescribed. Okay to switch? Please advise

## 2017-12-08 NOTE — Telephone Encounter (Signed)
Rx sent 

## 2017-12-08 NOTE — Telephone Encounter (Signed)
Okay to switch?  

## 2017-12-16 ENCOUNTER — Ambulatory Visit
Admission: RE | Admit: 2017-12-16 | Discharge: 2017-12-16 | Disposition: A | Discharge status: Home / Self Care | Payer: BLUE CROSS/BLUE SHIELD | Source: Ambulatory Visit | Attending: Gynecology | Admitting: Gynecology

## 2017-12-16 DIAGNOSIS — Z139 Encounter for screening, unspecified: Secondary | ICD-10-CM

## 2018-01-14 ENCOUNTER — Ambulatory Visit (INDEPENDENT_AMBULATORY_CARE_PROVIDER_SITE_OTHER): Payer: BLUE CROSS/BLUE SHIELD | Admitting: Licensed Clinical Social Worker

## 2018-01-14 ENCOUNTER — Ambulatory Visit: Payer: BLUE CROSS/BLUE SHIELD | Admitting: Licensed Clinical Social Worker

## 2018-01-14 ENCOUNTER — Telehealth: Payer: Self-pay | Admitting: *Deleted

## 2018-01-14 DIAGNOSIS — F321 Major depressive disorder, single episode, moderate: Secondary | ICD-10-CM | POA: Diagnosis not present

## 2018-01-14 NOTE — Telephone Encounter (Signed)
There are a variety of choices but I like to start with fluoxetine because it is generic, been around a long time and usually does well with patients.  It also allows for dose adjustments.  If she is okay with this then fluoxetine 20 mg daily.  #30.  1 refill.  Call at the end of the first month to let me know how she is doing and we can discuss whether we would like to adjust the dose up.  If she would start to feel more anxious or more depressed she needs to let us know that right away.  Sometimes patients will feel sedated on these medications.  I would tolerate this for a little bit as a lot of times patients get used to this.  If she continues to feel this way though we can change to a different medication.

## 2018-01-14 NOTE — Telephone Encounter (Signed)
Patient called requesting name of therapist to help with anxiety and depression I gave her the name of Carlye GrippeJulie Whitt,pt saw Raynelle FanningJulie today and she recommended patient start on antidepressant. Stevenson ClinchJuile Whitt was hoping you would agree to do this. Patient said Raynelle FanningJulie said you can call her if you would like to discuss [phone]. Please advise

## 2018-01-15 MED ORDER — FLUOXETINE HCL 20 MG PO CAPS: 20.0000 mg | ORAL_CAPSULE | Freq: Every day | ORAL | 1 refills | Status: DC

## 2018-01-15 NOTE — Telephone Encounter (Signed)
Pt informed, Rx sent. 

## 2018-01-15 NOTE — Telephone Encounter (Signed)
Left message for pt to call.

## 2018-01-21 ENCOUNTER — Ambulatory Visit (INDEPENDENT_AMBULATORY_CARE_PROVIDER_SITE_OTHER): Payer: BLUE CROSS/BLUE SHIELD | Admitting: Licensed Clinical Social Worker

## 2018-01-21 ENCOUNTER — Ambulatory Visit: Payer: BLUE CROSS/BLUE SHIELD | Admitting: Licensed Clinical Social Worker

## 2018-01-21 DIAGNOSIS — F324 Major depressive disorder, single episode, in partial remission: Secondary | ICD-10-CM | POA: Diagnosis not present

## 2018-01-26 ENCOUNTER — Ambulatory Visit: Payer: BLUE CROSS/BLUE SHIELD | Admitting: Licensed Clinical Social Worker

## 2018-02-12 ENCOUNTER — Telehealth: Payer: Self-pay | Admitting: *Deleted

## 2018-02-12 MED ORDER — FLUOXETINE HCL 10 MG PO CAPS: 10.0000 mg | ORAL_CAPSULE | Freq: Every day | ORAL | 9 refills | Status: DC

## 2018-02-12 NOTE — Telephone Encounter (Signed)
Patient called to follow up with Prozac 20 mg dose started last month, she asked if the dose can be lowered to 10 mg. Please advise

## 2018-02-12 NOTE — Telephone Encounter (Signed)
Okay for Prozac 10 mg daily refill through next annual exam

## 2018-02-12 NOTE — Telephone Encounter (Signed)
Rx sent 

## 2018-02-24 ENCOUNTER — Ambulatory Visit (INDEPENDENT_AMBULATORY_CARE_PROVIDER_SITE_OTHER): Payer: BLUE CROSS/BLUE SHIELD | Admitting: Licensed Clinical Social Worker

## 2018-02-24 DIAGNOSIS — F324 Major depressive disorder, single episode, in partial remission: Secondary | ICD-10-CM

## 2018-03-03 ENCOUNTER — Telehealth: Payer: Self-pay | Admitting: *Deleted

## 2018-03-03 ENCOUNTER — Ambulatory Visit: Payer: BLUE CROSS/BLUE SHIELD | Admitting: Licensed Clinical Social Worker

## 2018-03-03 MED ORDER — FLUOXETINE HCL 10 MG PO CAPS: 10.0000 mg | ORAL_CAPSULE | Freq: Every day | ORAL | 0 refills | Status: DC

## 2018-03-03 NOTE — Telephone Encounter (Signed)
Patient takes Prozac 10 mg daily, [place] and needs a early refill on medication, next refill not due until another 10 days, pharmacy told patient she will need to get the okay from you to get early refill. Okay to relay to pharmacy to fill medication early?

## 2018-03-03 NOTE — Telephone Encounter (Signed)
Rx sent, pt aware 

## 2018-03-03 NOTE — Telephone Encounter (Signed)
Yes

## 2018-03-18 ENCOUNTER — Ambulatory Visit: Payer: Self-pay | Admitting: Licensed Clinical Social Worker

## 2018-03-25 ENCOUNTER — Ambulatory Visit (INDEPENDENT_AMBULATORY_CARE_PROVIDER_SITE_OTHER): Payer: No Typology Code available for payment source | Admitting: Licensed Clinical Social Worker

## 2018-03-25 DIAGNOSIS — F324 Major depressive disorder, single episode, in partial remission: Secondary | ICD-10-CM

## 2018-04-01 ENCOUNTER — Ambulatory Visit: Payer: BLUE CROSS/BLUE SHIELD | Admitting: Licensed Clinical Social Worker

## 2018-04-07 ENCOUNTER — Encounter: Payer: Self-pay | Admitting: Gynecology

## 2018-04-07 ENCOUNTER — Ambulatory Visit: Payer: PRIVATE HEALTH INSURANCE | Admitting: Gynecology

## 2018-04-07 VITALS — BP 118/74

## 2018-04-07 DIAGNOSIS — R350 Frequency of micturition: Secondary | ICD-10-CM

## 2018-04-07 DIAGNOSIS — R102 Pelvic and perineal pain: Secondary | ICD-10-CM | POA: Diagnosis not present

## 2018-04-07 DIAGNOSIS — N912 Amenorrhea, unspecified: Secondary | ICD-10-CM

## 2018-04-07 NOTE — Progress Notes (Signed)
    Cheryl Webster 05/12/1976 [id number]        42 y.o.  [id number] resents complaining of pelvic cramping over the past week.  Particularly precipitated with exercise.  No bleeding.  Last menses in May.  Patient currently on continuous oral contraceptives for menstrual suppression and migraine headache suppression.  Otherwise doing well with this.  Noticing some urinary frequency but no dysuria urgency low back pain fever or chills.  She does have a past history of ovarian cysts several years ago and wonders whether it is related to this.  No nausea vomiting diarrhea constipation.  Past medical history,surgical history, problem list, medications, allergies, family history and social history were all reviewed and documented in the EPIC chart.  Directed ROS with pertinent positives and negatives documented in the history of present illness/assessment and plan.  Exam: Kennon PortelaKim Gardner assistant Vitals:   04/07/18 0932  BP: 118/74   General appearance:  Normal Abdomen soft nontender without masses guarding rebound Pelvic external BUS vagina normal.  Cervix normal.  Uterus normal size midline mobile nontender.  Adnexa without masses or tenderness.  Assessment/Plan:  42 y.o. Cheryl Webster with pelvic cramping on continuous oral contraceptives.  Question whether she is trying to have a menses despite the continuous pills.  Also possible small ovarian cysts although her pelvic exam is normal.  Possible urinary with the urinary frequency but no other signs of UTIs.  Will check baseline CBC for white count to rule out infectious, hCG although on continuous pills and vasectomy highly unlikely and urine analysis rule out UTI.  We will monitor her symptoms this week.  If starts bleeding then she will stop her pills for 1 week and then restart a new pack.  If cramping continues but no bleeding then will call and we will plan on scheduling an ultrasound for baseline pelvic surveillance.  If cramping resolves then she will  continue on her continuous oral contraceptives and will go from there.  I spent a total of 15 face-to-face minutes with the patient, over 50% was spent counseling and coordination of care.     Dara Lordsimothy P Carnita Golob MD, 9:44 AM 04/07/2018

## 2018-04-07 NOTE — Patient Instructions (Signed)
Call if the pelvic cramping continues and we will schedule an ultrasound for next week.

## 2018-04-08 ENCOUNTER — Telehealth: Payer: Self-pay | Admitting: *Deleted

## 2018-04-08 ENCOUNTER — Ambulatory Visit: Payer: BLUE CROSS/BLUE SHIELD | Admitting: Licensed Clinical Social Worker

## 2018-04-08 LAB — CBC WITH DIFFERENTIAL/PLATELET
BASOS PCT: 1.2 %
Basophils Absolute: 52 cells/uL (ref 0–200)
EOS PCT: 2.8 %
Eosinophils Absolute: 120 cells/uL (ref 15–500)
HCT: 39.7 % (ref 35.0–45.0)
Hemoglobin: 13.5 g/dL (ref 11.7–15.5)
Lymphs Abs: 1535 cells/uL (ref 850–3900)
MCH: 32.1 pg (ref 27.0–33.0)
MCHC: 34 g/dL (ref 32.0–36.0)
MCV: 94.3 fL (ref 80.0–100.0)
MONOS PCT: 6.2 %
MPV: 11 fL (ref 7.5–12.5)
NEUTROS ABS: 2326 {cells}/uL (ref 1500–7800)
Neutrophils Relative %: 54.1 %
Platelets: 315 10*3/uL (ref 140–400)
RBC: 4.21 10*6/uL (ref 3.80–5.10)
RDW: 12.2 % (ref 11.0–15.0)
Total Lymphocyte: 35.7 %
WBC mixed population: 267 cells/uL (ref 200–950)
WBC: 4.3 10*3/uL (ref 3.8–10.8)

## 2018-04-08 LAB — HCG, SERUM, QUALITATIVE: Preg, Serum: NEGATIVE

## 2018-04-08 MED ORDER — CIPROFLOXACIN HCL 250 MG PO TABS: 250.0000 mg | ORAL_TABLET | Freq: Two times a day (BID) | ORAL | 0 refills | Status: DC

## 2018-04-08 NOTE — Telephone Encounter (Signed)
Our urine analysis showed the same thing.  We have a culture pending.  I was going to wait for the results before considering antibiotics but if she is developing symptoms then I would go ahead with ciprofloxacin 250 mg twice daily x3 days.

## 2018-04-08 NOTE — Telephone Encounter (Signed)
Patient was seen yesterday for office visit, wanted to let you know she did a home UTI test yesterday evening  and it was positive for leukocytes.Patient said she was having urgency and pressure. Please advise

## 2018-04-08 NOTE — Telephone Encounter (Signed)
Patient aware, Rx sent. She wanted to wait for the culture before taking Rx, states will try AZO until culture returns and if symptoms worsen she will pick up Rx at pharmacy.

## 2018-04-09 LAB — URINALYSIS, COMPLETE W/RFL CULTURE
BACTERIA UA: NONE SEEN /HPF
Bilirubin Urine: NEGATIVE
Glucose, UA: NEGATIVE
HGB URINE DIPSTICK: NEGATIVE
HYALINE CAST: NONE SEEN /LPF
KETONES UR: NEGATIVE
Nitrites, Initial: NEGATIVE
Protein, ur: NEGATIVE
SQUAMOUS EPITHELIAL / LPF: NONE SEEN /HPF (ref <=5)
Specific Gravity, Urine: 1.017 (ref 1.001–1.03)
WBC UA: NONE SEEN /HPF (ref 0–5)
pH: <=5 (ref 5.0–8.0)

## 2018-04-09 LAB — URINE CULTURE
MICRO NUMBER:: 90816626
SPECIMEN QUALITY: ADEQUATE

## 2018-04-09 LAB — CULTURE INDICATED

## 2018-04-10 ENCOUNTER — Telehealth: Payer: Self-pay | Admitting: *Deleted

## 2018-04-10 DIAGNOSIS — R102 Pelvic and perineal pain: Secondary | ICD-10-CM

## 2018-04-10 NOTE — Telephone Encounter (Signed)
If symptoms persisting then okay to schedule ultrasound

## 2018-04-10 NOTE — Telephone Encounter (Signed)
Patient called and left message in triage voicemail requesting urine culture results. I called patient back and left detailed message on voicemail negative culture.

## 2018-04-10 NOTE — Telephone Encounter (Signed)
Left on voicemail ultrasound order placed.

## 2018-04-10 NOTE — Telephone Encounter (Signed)
Dr.Fontaine patient called back from my message below for urine culture on 04/07/18 , she never took the Cipro 250 mg prescribed on 04/08/18 she wanted to wait to see what culture indicated. She increased fluids, had cranberry juice which helped some, but she states symptoms return, she is leery to take antibiotic if no infection, asked if leukocyte could be causing the discomfort? And if she should go ahead and schedule ultrasound?

## 2018-04-15 ENCOUNTER — Ambulatory Visit: Payer: BLUE CROSS/BLUE SHIELD | Admitting: Licensed Clinical Social Worker

## 2018-04-22 ENCOUNTER — Other Ambulatory Visit: Payer: Self-pay | Admitting: Gynecology

## 2018-04-22 ENCOUNTER — Ambulatory Visit: Payer: BLUE CROSS/BLUE SHIELD | Admitting: Licensed Clinical Social Worker

## 2018-04-22 ENCOUNTER — Ambulatory Visit: Payer: PRIVATE HEALTH INSURANCE | Admitting: Gynecology

## 2018-04-22 ENCOUNTER — Encounter: Payer: Self-pay | Admitting: Gynecology

## 2018-04-22 ENCOUNTER — Ambulatory Visit (INDEPENDENT_AMBULATORY_CARE_PROVIDER_SITE_OTHER): Payer: PRIVATE HEALTH INSURANCE

## 2018-04-22 VITALS — BP 118/76

## 2018-04-22 DIAGNOSIS — N83202 Unspecified ovarian cyst, left side: Secondary | ICD-10-CM

## 2018-04-22 DIAGNOSIS — R102 Pelvic and perineal pain: Secondary | ICD-10-CM

## 2018-04-22 NOTE — Patient Instructions (Signed)
Follow-up if your symptoms worsen or change and you develop new symptoms.

## 2018-04-22 NOTE — Progress Notes (Signed)
    Theone Murdochllison Rancourt 08/27/1976 [id number]        42 y.o.  [id number] presents for ultrasound due to history of pelvic cramping.  Usually follows strenuous exercise.  On continuous oral contraceptives.  Recent CBC to include white count was normal as was a negative hCG  Past medical history,surgical history, problem list, medications, allergies, family history and social history were all reviewed and documented in the EPIC chart.  Directed ROS with pertinent positives and negatives documented in the history of present illness/assessment and plan.  Exam: Vitals:   04/22/18 0947  BP: 118/76   General appearance:  Normal  Ultrasound transvaginal shows uterus normal size and echotexture.  Endometrial echo 1.8 mm.  Right and left ovaries visualized and normal.  Cul-de-sac negative.  Assessment/Plan:  42 y.o. [id number] with history of pelvic cramping particularly with strenuous activity.  Ultrasound is normal.  Reviewed with patient differential to include GYN versus non-GYN such as GI/musculoskeletal.  At this point the patient is comfortable with following expectantly as it does not seem overly bothersome to her.  She will follow-up if her symptoms worsen or change.    Dara Lordsimothy P Fontaine MD, 10:55 AM 04/22/2018

## 2018-04-29 ENCOUNTER — Ambulatory Visit (INDEPENDENT_AMBULATORY_CARE_PROVIDER_SITE_OTHER): Payer: No Typology Code available for payment source | Admitting: Licensed Clinical Social Worker

## 2018-04-29 DIAGNOSIS — F324 Major depressive disorder, single episode, in partial remission: Secondary | ICD-10-CM | POA: Diagnosis not present

## 2018-05-20 ENCOUNTER — Ambulatory Visit (INDEPENDENT_AMBULATORY_CARE_PROVIDER_SITE_OTHER): Payer: No Typology Code available for payment source | Admitting: Licensed Clinical Social Worker

## 2018-05-20 DIAGNOSIS — F324 Major depressive disorder, single episode, in partial remission: Secondary | ICD-10-CM | POA: Diagnosis not present

## 2018-06-10 ENCOUNTER — Ambulatory Visit: Payer: No Typology Code available for payment source | Admitting: Licensed Clinical Social Worker

## 2018-06-10 DIAGNOSIS — F324 Major depressive disorder, single episode, in partial remission: Secondary | ICD-10-CM | POA: Diagnosis not present

## 2018-07-08 ENCOUNTER — Ambulatory Visit: Payer: Self-pay | Admitting: Licensed Clinical Social Worker

## 2018-07-16 ENCOUNTER — Ambulatory Visit: Payer: No Typology Code available for payment source | Admitting: Licensed Clinical Social Worker

## 2018-07-16 DIAGNOSIS — F324 Major depressive disorder, single episode, in partial remission: Secondary | ICD-10-CM

## 2018-08-12 ENCOUNTER — Ambulatory Visit: Payer: No Typology Code available for payment source | Admitting: Licensed Clinical Social Worker

## 2018-08-18 ENCOUNTER — Ambulatory Visit: Payer: No Typology Code available for payment source | Admitting: Licensed Clinical Social Worker

## 2018-08-18 DIAGNOSIS — F324 Major depressive disorder, single episode, in partial remission: Secondary | ICD-10-CM

## 2018-08-21 ENCOUNTER — Ambulatory Visit: Payer: No Typology Code available for payment source | Admitting: Family Medicine

## 2018-08-21 ENCOUNTER — Encounter: Payer: Self-pay | Admitting: Family Medicine

## 2018-08-21 VITALS — BP 120/62 | HR 53 | Temp 97.6°F | Ht 66.5 in | Wt 153.4 lb

## 2018-08-21 DIAGNOSIS — R300 Dysuria: Secondary | ICD-10-CM | POA: Diagnosis not present

## 2018-08-21 DIAGNOSIS — H6983 Other specified disorders of Eustachian tube, bilateral: Secondary | ICD-10-CM

## 2018-08-21 DIAGNOSIS — Z5309 Procedure and treatment not carried out because of other contraindication: Secondary | ICD-10-CM

## 2018-08-21 DIAGNOSIS — F411 Generalized anxiety disorder: Secondary | ICD-10-CM | POA: Diagnosis not present

## 2018-08-21 DIAGNOSIS — Z789 Other specified health status: Secondary | ICD-10-CM

## 2018-08-21 DIAGNOSIS — H6993 Unspecified Eustachian tube disorder, bilateral: Secondary | ICD-10-CM

## 2018-08-21 DIAGNOSIS — Z8739 Personal history of other diseases of the musculoskeletal system and connective tissue: Secondary | ICD-10-CM

## 2018-08-21 DIAGNOSIS — N951 Menopausal and female climacteric states: Secondary | ICD-10-CM

## 2018-08-21 LAB — URINALYSIS, ROUTINE W REFLEX MICROSCOPIC
Bilirubin Urine: NEGATIVE
Hgb urine dipstick: NEGATIVE
Ketones, ur: NEGATIVE
Leukocytes, UA: NEGATIVE
Nitrite: NEGATIVE
RBC / HPF: NONE SEEN (ref 0–?)
Specific Gravity, Urine: <=1.005 — AB (ref 1.000–1.030)
Total Protein, Urine: NEGATIVE
Urine Glucose: NEGATIVE
Urobilinogen, UA: 0.2 (ref 0.0–1.0)
WBC, UA: NONE SEEN (ref 0–?)
pH: 5.5 (ref 5.0–8.0)

## 2018-08-21 NOTE — Patient Instructions (Signed)
Try D-Mannose for urinary symptoms. Consider Magnesium for anxiety and sleep. Try a women's bike seat or cover. I will speak with Dr. Berline Choughigby re: oral manipulation.

## 2018-08-21 NOTE — Progress Notes (Signed)
Cheryl Webster is a 42 y.o. female [facility]   Patient Care Team: Helane RimaWallace, Geovany Trudo, DO as PCP - General (Family Medicine)   History of Present Illness:   Cheryl MortJoEllen Webster, CMA acting as scribe for Dr. Helane RimaErica Avani Sensabaugh.   HPI: Patient in office to establish care. She is interested in trying to find some alternative treatments for menopause. She is on an OCP and Prozac to help with symptoms but she wanted to know if any other options are available to help with symptoms.   Health Maintenance Due  Topic Date Due  . HIV Screening  11/05/1990   Depression screen PHQ 2/9 08/21/2018  Decreased Interest 0  Down, Depressed, Hopeless 0  PHQ - 2 Score 0  Altered sleeping 0  Tired, decreased energy 0  Change in appetite 0  Feeling bad or failure about yourself  0  Trouble concentrating 0  Moving slowly or fidgety/restless 0  Suicidal thoughts 0  PHQ-9 Score 0  Difficult doing work/chores Not difficult at all    PMHx, SurgHx, SocialHx, Medications, and Allergies were reviewed in the Visit Navigator and updated as appropriate.   Past Medical History:  Diagnosis Date  . Anxiety   . ASCUS of cervix with negative high risk HPV 11/2017  . Breast cyst right  . Frequent headaches      Past Surgical History:  Procedure Laterality Date  . IUD REMOVAL    . TEMPOROMANDIBULAR JOINT SURGERY  AGE13  . THERAPEUTIC ABORTION  AGE 47, 21  . TONSILLECTOMY AND ADENOIDECTOMY  AGE 5  . TYMPANOSTOMY TUBE PLACEMENT  AGE 30s     Family History  Problem Relation Age of Onset  . Hypertension Father   . Other Father   . Breast cancer Maternal Grandmother        Postmenopausal  . Rectal cancer Maternal Grandmother   . Heart disease Paternal Grandmother     Social History   Tobacco Use  . Smoking status: Never Smoker  . Smokeless tobacco: Never Used  Substance Use Topics  . Alcohol use: Yes    Alcohol/week: 0.0 standard drinks    Comment: Rare  . Drug use: No    Current  Medications and Allergies:   .  ALPRAZolam (XANAX) 0.5 MG tablet, Take 1 tablet (0.5 mg total) by mouth every 6 (six) hours as needed for anxiety., Disp: 20 tablet, Rfl: 1 .  Cholecalciferol (VITAMIN D PO), Take by mouth., Disp: , Rfl:  .  Cranberry Extract 200 MG CAPS, Take by mouth., Disp: , Rfl:  .  FLUoxetine (PROZAC) 10 MG capsule, Take 1 capsule (10 mg total) by mouth daily., Disp: 30 capsule, Rfl: 0 .  Lactobacillus (ACIDOPHILUS PO), Take by mouth.  , Disp: , Rfl:  .  Multiple Vitamins-Iron (MULTIVITAMIN/IRON PO), Take by mouth.  , Disp: , Rfl:  .  norethindrone-ethinyl estradiol (MICROGESTIN,JUNEL,LOESTRIN) 1-20 MG-MCG tablet, Take one active pill every day continuously as directed., Disp: 3 Package, Rfl: 4   Allergies  Allergen Reactions  . Aleve [Naproxen Sodium]     It was the Aleve cold and sinus therefore patient doesn't take any aleve  . Bee Venom Swelling  . Sulfa Antibiotics Hives   Review of Systems:   Pertinent items are noted in the HPI. Otherwise, ROS is negative.  Vitals:   Vitals:   08/21/18 1043  BP: 120/62  Pulse: (!) 53  Temp: 97.6 F (36.4 C)  TempSrc: Oral  SpO2: 99%  Weight: 153 lb  6.4 oz (69.6 kg)  Height: 5' 6.5" (1.689 m)     Body mass index is 24.39 kg/m.  Physical Exam:   Physical Exam  Constitutional: She is oriented to person, place, and time. She appears well-developed and well-nourished. No distress.  HENT:  Head: Normocephalic and atraumatic.  Right Ear: External ear normal.  Left Ear: External ear normal.  Nose: Nose normal.  Mouth/Throat: Oropharynx is clear and moist.  Eyes: Pupils are equal, round, and reactive to light. Conjunctivae and EOM are normal.  Neck: Normal range of motion. Neck supple. No thyromegaly present.  Cardiovascular: Normal rate, regular rhythm, normal heart sounds and intact distal pulses.  Pulmonary/Chest: Effort normal and breath sounds normal.  Abdominal: Soft. Bowel sounds are normal.    Musculoskeletal: Normal range of motion.  Lymphadenopathy:    She has no cervical adenopathy.  Neurological: She is alert and oriented to person, place, and time.  Skin: Skin is warm and dry. Capillary refill takes less than 2 seconds.  Psychiatric: She has a normal mood and affect. Her behavior is normal.  Nursing note and vitals reviewed.  Assessment and Plan:   Pseudo-vegetarian diet, no red meat Reviewed the importance of healthy protein in diet and discussed a B complex.  Perimenopausal symptoms, on OCP, followed by Dr. Audie Box Discussed perimenopausal symptoms and treatment. I recommend following GYN recommendations. Stay away from testosterone pellets/bioidenticals. Continue to exercise, eat well, and meditate.   GAD (generalized anxiety disorder), controlled with Prozac and counseling Doing well. No changes.  ETD (Eustachian tube dysfunction), R > L, with Hx of tympanostomy tubes in 30s, resulted in pulsatile tinnitus Patient prefers no medication treatment. She is using Atrovent nasal spray with some relief. Hx of TMJ surgery bilaterally. I will ask Dr. Berline Chough if OMT could be helpful. Otherwise, we will monitor closely when she endorses otalgia.   Dysuria, intermittent, with Hx of negative UCx Possibly due to chronic irritation. Recommended women's bike seat for more comfort when spinning. UCx today.  Orders Placed This Encounter  Procedures  . Urine Culture  . Urinalysis, Routine w reflex microscopic   . Reviewed expectations re: course of current medical issues. . Discussed self-management of symptoms. . Outlined signs and symptoms indicating need for more acute intervention. . Patient verbalized understanding and all questions were answered. Marland Kitchen Health Maintenance issues including appropriate healthy diet, exercise, and smoking avoidance were discussed with patient. . See orders for this visit as documented in the electronic medical record. . Patient received an After  Visit Summary.  Helane Rima, DO [place] 08/22/2018  CMA served as Neurosurgeon during this visit. History, Physical, and Plan performed by medical provider. The above documentation has been reviewed and is accurate and complete. Helane Rima, D.O.

## 2018-08-22 ENCOUNTER — Encounter: Payer: Self-pay | Admitting: Family Medicine

## 2018-08-22 DIAGNOSIS — Z789 Other specified health status: Secondary | ICD-10-CM | POA: Insufficient documentation

## 2018-08-22 DIAGNOSIS — R300 Dysuria: Secondary | ICD-10-CM | POA: Insufficient documentation

## 2018-08-22 DIAGNOSIS — Z5309 Procedure and treatment not carried out because of other contraindication: Secondary | ICD-10-CM | POA: Insufficient documentation

## 2018-08-22 DIAGNOSIS — F411 Generalized anxiety disorder: Secondary | ICD-10-CM | POA: Insufficient documentation

## 2018-08-22 DIAGNOSIS — H6983 Other specified disorders of Eustachian tube, bilateral: Secondary | ICD-10-CM | POA: Insufficient documentation

## 2018-08-22 DIAGNOSIS — H6993 Unspecified Eustachian tube disorder, bilateral: Secondary | ICD-10-CM | POA: Insufficient documentation

## 2018-08-22 DIAGNOSIS — Z8739 Personal history of other diseases of the musculoskeletal system and connective tissue: Secondary | ICD-10-CM | POA: Insufficient documentation

## 2018-08-22 DIAGNOSIS — N951 Menopausal and female climacteric states: Secondary | ICD-10-CM | POA: Insufficient documentation

## 2018-08-22 LAB — URINE CULTURE
MICRO NUMBER:: 91411473
Result:: NO GROWTH
SPECIMEN QUALITY:: ADEQUATE

## 2018-08-22 NOTE — Assessment & Plan Note (Signed)
Discussed perimenopausal symptoms and treatment. I recommend following GYN recommendations. Stay away from testosterone pellets/bioidenticals. Continue to exercise, eat well, and meditate.

## 2018-08-22 NOTE — Assessment & Plan Note (Signed)
Possibly due to chronic irritation. Recommended women's bike seat for more comfort when spinning. UCx today.

## 2018-08-22 NOTE — Assessment & Plan Note (Signed)
Patient prefers no medication treatment. She is using Atrovent nasal spray with some relief. Hx of TMJ surgery bilaterally. I will ask Dr. Berline Choughigby if OMT could be helpful. Otherwise, we will monitor closely when she endorses otalgia.

## 2018-08-22 NOTE — Assessment & Plan Note (Signed)
Reviewed the importance of healthy protein in diet and discussed a B complex.

## 2018-08-22 NOTE — Assessment & Plan Note (Signed)
Doing well No changes 

## 2018-09-08 ENCOUNTER — Ambulatory Visit: Payer: No Typology Code available for payment source | Admitting: Licensed Clinical Social Worker

## 2018-09-08 DIAGNOSIS — F324 Major depressive disorder, single episode, in partial remission: Secondary | ICD-10-CM | POA: Diagnosis not present

## 2018-09-09 ENCOUNTER — Encounter: Payer: Self-pay | Admitting: Physician Assistant

## 2018-10-13 ENCOUNTER — Ambulatory Visit: Payer: No Typology Code available for payment source | Admitting: Licensed Clinical Social Worker

## 2018-10-13 DIAGNOSIS — F324 Major depressive disorder, single episode, in partial remission: Secondary | ICD-10-CM

## 2018-11-09 ENCOUNTER — Other Ambulatory Visit: Payer: Self-pay | Admitting: Gynecology

## 2018-11-09 DIAGNOSIS — Z1231 Encounter for screening mammogram for malignant neoplasm of breast: Secondary | ICD-10-CM

## 2018-11-10 ENCOUNTER — Other Ambulatory Visit: Payer: Self-pay | Admitting: Gynecology

## 2018-11-17 ENCOUNTER — Ambulatory Visit (INDEPENDENT_AMBULATORY_CARE_PROVIDER_SITE_OTHER): Payer: No Typology Code available for payment source | Admitting: Licensed Clinical Social Worker

## 2018-11-17 DIAGNOSIS — F324 Major depressive disorder, single episode, in partial remission: Secondary | ICD-10-CM | POA: Diagnosis not present

## 2018-12-07 ENCOUNTER — Ambulatory Visit: Payer: PRIVATE HEALTH INSURANCE | Admitting: Gynecology

## 2018-12-07 ENCOUNTER — Encounter: Payer: Self-pay | Admitting: Gynecology

## 2018-12-07 VITALS — BP 110/70 | Ht 66.0 in | Wt 150.0 lb

## 2018-12-07 DIAGNOSIS — Z01419 Encounter for gynecological examination (general) (routine) without abnormal findings: Secondary | ICD-10-CM

## 2018-12-07 DIAGNOSIS — R87610 Atypical squamous cells of undetermined significance on cytologic smear of cervix (ASC-US): Secondary | ICD-10-CM | POA: Diagnosis not present

## 2018-12-07 MED ORDER — FLUOXETINE HCL 10 MG PO CAPS
10.0000 mg | ORAL_CAPSULE | Freq: Every day | ORAL | 12 refills | Status: DC
Start: 2018-12-07 — End: 2019-02-04

## 2018-12-07 MED ORDER — NORETHINDRONE ACET-ETHINYL EST 1-20 MG-MCG PO TABS: ORAL_TABLET | ORAL | 4 refills | Status: DC

## 2018-12-07 NOTE — Progress Notes (Signed)
    Cheryl Webster Jun 20, 1976 [id number]        43 y.o.  [id number] for annual gynecologic exam.  Doing well without gynecologic complaints.  Had some pelvic cramping last year with work-up negative to include negative ultrasound.  The symptoms have resolved.  She is on continuous oral contraceptives without bleeding.  Pap smear last year showed ASCUS negative high risk HPV.  Past medical history,surgical history, problem list, medications, allergies, family history and social history were all reviewed and documented as reviewed in the EPIC chart.  ROS:  Performed with pertinent positives and negatives included in the history, assessment and plan.   Additional significant findings : None   Exam: Kennon Portela assistant Vitals:   12/07/18 1514  BP: 110/70  Weight: 150 lb (68 kg)  Height: 5\' 6"  (1.676 m)   Body mass index is 24.21 kg/m.  General appearance:  Normal affect, orientation and appearance. Skin: Grossly normal HEENT: Without gross lesions.  No cervical or supraclavicular adenopathy. Thyroid normal.  Lungs:  Clear without wheezing, rales or rhonchi Cardiac: RR, without RMG Abdominal:  Soft, nontender, without masses, guarding, rebound, organomegaly or hernia Breasts:  Examined lying and sitting without masses, retractions, discharge or axillary adenopathy. Pelvic:  Ext, BUS, Vagina: Normal  Cervix: Normal.  Pap smear done  Uterus: Anteverted, normal size, shape and contour, midline and mobile nontender   Adnexa: Without masses or tenderness    Anus and perineum: Normal   Rectovaginal: Normal sphincter tone without palpated masses or tenderness.    Assessment/Plan:  43 y.o. [id number] female for annual gynecologic exam.  Without menses, continuous oral contraceptives, vasectomy birth control.  1. Continuous oral contraceptives.  Using this for irregular bleeding suppression noting an [facility] last year was 24.9.  Also having issues with migraines which are suppressed when she is on  continuous pills and not having menses.  We discussed the risks multiple times to include stroke heart attack and DVT.  She is comfortable continuing and I refilled her x1 year. 2. Fluoxetine.  Continues on fluoxetine 10 mg daily.  Is actively seeing a counselor and is planning on weaning this coming year.  Refill x1 year provided. 3. Mammography coming due and I reminded her to schedule this.  Breast exam normal today. 4. Pap smear 2019 showed ASCUS negative high risk HPV.  Pap smear done today.  No history of abnormal Pap smears previously. 5. Health maintenance.  Baseline CBC and CMP done today.  Has lipids checked at health screening at work.  Follow-up 1 year, sooner as needed.   Dara Lords MD, 3:39 PM 12/07/2018

## 2018-12-07 NOTE — Addendum Note (Signed)
Addended by: Dayna Barker on: 12/07/2018 03:53 PM   Modules accepted: Orders

## 2018-12-07 NOTE — Patient Instructions (Signed)
Follow-up in 1 year for annual exam, sooner if any issues. 

## 2018-12-08 LAB — COMPREHENSIVE METABOLIC PANEL
AG Ratio: 1.6 (calc) (ref 1.0–2.5)
ALT: 10 U/L (ref 6–29)
AST: 15 U/L (ref 10–30)
Albumin: 4 g/dL (ref 3.6–5.1)
Alkaline phosphatase (APISO): 30 U/L — ABNORMAL LOW (ref 31–125)
BUN: 12 mg/dL (ref 7–25)
CO2: 28 mmol/L (ref 20–32)
Calcium: 9.2 mg/dL (ref 8.6–10.2)
Chloride: 103 mmol/L (ref 98–110)
Creat: 0.91 mg/dL (ref 0.50–1.10)
Globulin: 2.5 g/dL (calc) (ref 1.9–3.7)
Glucose, Bld: 64 mg/dL — ABNORMAL LOW (ref 65–99)
Potassium: 3.8 mmol/L (ref 3.5–5.3)
Sodium: 140 mmol/L (ref 135–146)
Total Bilirubin: 0.4 mg/dL (ref 0.2–1.2)
Total Protein: 6.5 g/dL (ref 6.1–8.1)

## 2018-12-08 LAB — CBC WITH DIFFERENTIAL/PLATELET
Absolute Monocytes: 353 cells/uL (ref 200–950)
BASOS PCT: 0.6 %
Basophils Absolute: 37 cells/uL (ref 0–200)
EOS PCT: 2.4 %
Eosinophils Absolute: 149 cells/uL (ref 15–500)
HEMATOCRIT: 39 % (ref 35.0–45.0)
HEMOGLOBIN: 13.3 g/dL (ref 11.7–15.5)
LYMPHS ABS: 2511 {cells}/uL (ref 850–3900)
MCH: 32.2 pg (ref 27.0–33.0)
MCHC: 34.1 g/dL (ref 32.0–36.0)
MCV: 94.4 fL (ref 80.0–100.0)
MPV: 10.2 fL (ref 7.5–12.5)
Monocytes Relative: 5.7 %
NEUTROS ABS: 3150 {cells}/uL (ref 1500–7800)
Neutrophils Relative %: 50.8 %
Platelets: 349 10*3/uL (ref 140–400)
RBC: 4.13 10*6/uL (ref 3.80–5.10)
RDW: 11.8 % (ref 11.0–15.0)
Total Lymphocyte: 40.5 %
WBC: 6.2 10*3/uL (ref 3.8–10.8)

## 2018-12-09 LAB — PAP IG W/ RFLX HPV ASCU

## 2018-12-15 ENCOUNTER — Ambulatory Visit (INDEPENDENT_AMBULATORY_CARE_PROVIDER_SITE_OTHER): Payer: No Typology Code available for payment source | Admitting: Licensed Clinical Social Worker

## 2018-12-15 DIAGNOSIS — F324 Major depressive disorder, single episode, in partial remission: Secondary | ICD-10-CM | POA: Diagnosis not present

## 2018-12-18 ENCOUNTER — Telehealth: Payer: Self-pay | Admitting: *Deleted

## 2018-12-18 MED ORDER — ALPRAZOLAM 0.5 MG PO TABS: 0.5000 mg | ORAL_TABLET | Freq: Three times a day (TID) | ORAL | 1 refills | Status: DC | PRN

## 2018-12-18 NOTE — Telephone Encounter (Signed)
Okay to refill with #30.  1 p.o. every 8 hour as needed anxiety with 1 refill

## 2018-12-18 NOTE — Telephone Encounter (Signed)
Patient called Xanax 0.5 mg tablet has expired, asked if new Rx could be called into pharmacy. Patient takes PRN anxiety. Please advise

## 2018-12-18 NOTE — Telephone Encounter (Signed)
Patient informed. Rx called in.

## 2018-12-21 ENCOUNTER — Ambulatory Visit: Payer: Self-pay

## 2019-01-04 ENCOUNTER — Telehealth: Payer: Self-pay | Admitting: Family Medicine

## 2019-01-04 NOTE — Telephone Encounter (Signed)
Offer virtual appointment with me, Sam, or Parker.

## 2019-01-04 NOTE — Telephone Encounter (Signed)
Government social research officer at [facility] Night - Human resources officer [facility] Night Physician Helane Rima- DO Contact Type Call Who Is Calling Patient / Member / Family / Caregiver Caller Name Paulyne Donan Caller Phone Number [phone] Patient Name Cheryl Webster Patient DOB 1975/11/20 Call Type Message Only Information Provided Reason for Call Request for General Office Information Initial Comment Caller states that she has a temperature of 100.5, congestion, sore throat, body aches, tiredness, headache. Additional Comment Call Closed By: Erskine Emery Transaction Date/Time: 01/02/2019 3:51:15 PM (ET)

## 2019-01-05 ENCOUNTER — Ambulatory Visit: Payer: Self-pay | Admitting: Family Medicine

## 2019-01-05 ENCOUNTER — Encounter: Payer: Self-pay | Admitting: Physician Assistant

## 2019-01-05 ENCOUNTER — Ambulatory Visit: Payer: Self-pay | Admitting: *Deleted

## 2019-01-05 ENCOUNTER — Ambulatory Visit (INDEPENDENT_AMBULATORY_CARE_PROVIDER_SITE_OTHER): Payer: No Typology Code available for payment source | Admitting: Physician Assistant

## 2019-01-05 VITALS — HR 41 | Temp 98.0°F

## 2019-01-05 DIAGNOSIS — R6889 Other general symptoms and signs: Secondary | ICD-10-CM

## 2019-01-05 DIAGNOSIS — Z20822 Contact with and (suspected) exposure to covid-19: Secondary | ICD-10-CM

## 2019-01-05 MED ORDER — ALBUTEROL SULFATE HFA 108 (90 BASE) MCG/ACT IN AERS: 1.0000 | INHALATION_SPRAY | Freq: Four times a day (QID) | RESPIRATORY_TRACT | 1 refills | Status: DC | PRN

## 2019-01-05 NOTE — Addendum Note (Signed)
Addended by: Jimmye Norman on: 01/05/2019 11:16 AM   Modules accepted: Orders

## 2019-01-05 NOTE — Telephone Encounter (Signed)
To the provider that saw patient today.

## 2019-01-05 NOTE — Progress Notes (Signed)
Virtual Visit via Video   I connected with Cheryl Webster on 01/05/19 at  9:40 AM EDT by a video enabled telemedicine application and verified that I am speaking with the correct person using two identifiers. Location patient: Home Location provider: [facility], Office Persons participating in the virtual visit: Montoya Prudent, Jarold Motto, PA, [place], [place].   I discussed the limitations of evaluation and management by telemedicine and the availability of in person appointments. The patient expressed understanding and agreed to proceed.  Subjective:   HPI:   Cough Pt c/o having non-productive cough that is getting worse, some shortness of breath on exertion, had a headache and fever over the last couple days. Temp this morning is 98 but has been up to 100.5.  She also has had body aches and chest tightness.    Patient denies severe shortness of breath, chest pain, hemoptysis.  Patient was able to do a 4 mile walk yesterday and a 20-minute ride on her palatine bike.  Last night she felt like her cough kept her up all night.  She does have a pulse ox at home and it is 99%.  Denies any history of prior lung issues, including asthma, COPD, prior history of pneumonia.  Her daughter had the exact same symptoms around March 15 for about 1 week.  Her daughter was tested for strep and flu, and both were negative.  At that time there was not a lot of [place] testing available, so patient daughter was instructed to self quarantine.  Patient is concerned that at this point she may also have coded.  She has been self quarantining herself.  She is wearing a mask when out in public and washing hands very regularly.   ROS: See pertinent positives and negatives per HPI.  Patient Active Problem List   Diagnosis Date Noted  . GAD (generalized anxiety disorder), controlled with Prozac and counseling 08/22/2018  . Perimenopausal symptoms, on OCP, followed by Dr. Audie Box 08/22/2018  . ETD  (Eustachian tube dysfunction), R > L, with Hx of tympanostomy tubes in 30s, resulted in pulsatile tinnitus 08/22/2018  . Dysuria, intermittent, with Hx of negative UCx 08/22/2018  . Pseudo-vegetarian diet, no red meat 08/22/2018  . 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 Smoking status: Never Smoker  . Smokeless tobacco: Never Used  Substance Use Topics  . Alcohol use: Yes    Alcohol/week: 0.0 standard drinks    Comment: Rare    Current Outpatient Medications:  .  ALPRAZolam (XANAX) 0.5 MG tablet, Take 1 tablet (0.5 mg total) by mouth every 8 (eight) hours as needed for anxiety., Disp: 30 tablet, Rfl: 1 .  Cholecalciferol (VITAMIN D PO), Take by mouth., Disp: , Rfl:  .  Cranberry Extract 200 MG CAPS, Take by mouth., Disp: , Rfl:  .  FLUoxetine (PROZAC) 10 MG capsule, Take 1 capsule (10 mg total) by mouth daily., Disp: 30 capsule, Rfl: 12 .  Lactobacillus (ACIDOPHILUS PO), Take by mouth.  , Disp: , Rfl:  .  magnesium oxide (MAG-OX) 400 MG tablet, Take 400 mg by mouth daily., Disp: , Rfl:  .  Multiple Vitamins-Iron (MULTIVITAMIN/IRON PO), Take by mouth.  , Disp: , Rfl:  .  norethindrone-ethinyl estradiol (JUNEL 1/20) 1-20 MG-MCG tablet, TAKE ONE ACTIVE PILL EVERY DAY CONTINUOUSLY AS DIRECTED., Disp: 63 tablet, Rfl: 4 .  vitamin  B-12 (CYANOCOBALAMIN) 1000 MCG tablet, Take 1,000 mcg by mouth daily., Disp: , Rfl:   Allergies  Allergen Reactions  . Aleve [Naproxen Sodium]     It was the Aleve cold and sinus therefore patient doesn't take any aleve  . Bee Venom Swelling  . Sulfa Antibiotics Hives    Objective:   VITALS: Per patient if applicable, see vitals. GENERAL: Alert, appears well and in no acute distress. HEENT: Atraumatic, conjunctiva clear, no obvious abnormalities on inspection of external nose and ears. NECK: Normal movements of the head and neck.  CARDIOPULMONARY: No increased WOB. Speaking in clear sentences. I:E ratio WNL.  MS: Moves all visible extremities without noticeable abnormality. PSYCH: Pleasant and cooperative, well-groomed. Speech normal rate and rhythm. Affect is appropriate. Insight and judgement are appropriate. Attention is focused, linear, and appropriate.  NEURO: CN grossly intact. Oriented as arrived to appointment on time with no prompting. Moves both UE equally.  SKIN: No obvious lesions, wounds, erythema, or cyanosis noted on face or hands.  Assessment and Plan:   Diagnoses and all orders for this visit:  Suspected Covid-19 Virus Infection   No red flags found on today's exam.  Discussed current information that we know about COVID and current guidelines regarding staying at home and self quarantining.  I reviewed extensively with patient ER precautions.  Her vitals at this time are very reassuring.  I did recommend considering Delsym to help suppress her cough so she can sleep at night.  Patient verbalized understanding to plan.  . Reviewed expectations re: course of current medical issues. . Discussed self-management of symptoms. . Outlined signs and symptoms indicating need for more acute intervention. . Patient verbalized understanding and all questions were answered. Marland Kitchen. Health Maintenance issues including appropriate healthy diet, exercise, and smoking avoidance were discussed with patient. . See orders for this visit as documented in the electronic medical record. .  I discussed the assessment and treatment plan with the patient. The patient was provided an opportunity to ask questions and all were answered. The patient agreed with the plan and demonstrated an understanding of the instructions.   The patient was advised to call back or seek an in-person evaluation if the symptoms worsen or if the condition fails to improve as anticipated.  CMA or LPN served as scribe during this visit. History, Physical,  and Plan performed by medical provider. The above documentation has been reviewed and is accurate and complete.    [place] BoardmanSamantha Paras [place], [place] 01/05/2019

## 2019-01-05 NOTE — Patient Instructions (Signed)
It was great to see you!  You have a viral upper respiratory infection. Antibiotics are not needed for this.  Viral infections usually take 7-10 days to resolve.  The cough can last a few weeks to go away.  Given the current COVID-19 outbreak, it is my professional recommendation that for your symptoms you should self-isolate for at least 7 days from when your symptoms started. Please do not return to work until you are fever free for at least 3 days without medications and significant improvement of your symptoms.  If you develop severe shortness of breath, uncontrolled fevers, coughing up blood, confusion, chest pain, or signs of dehydration (such as significantly decreased urine amounts or dizziness with standing) please CALL the ER and then GO to the ER.  Push fluids and get plenty of rest.  Call clinic with questions.  I hope you start feeling better soon!  

## 2019-01-05 NOTE — Telephone Encounter (Signed)
Can she confirm that it is an albuterol inhaler? If so, she may use inhaler if she has episodes of severe coughing fits or any SOB. May use every 4-6 hours if needed.  Is inhaler expired? Would she like a new one sent in? Ok to send in 1 albuterol inhaler if needed.

## 2019-01-05 NOTE — Telephone Encounter (Signed)
Pt called with c/o having coughing that is getting worst, some shortness of breath on exertion, had a headache and fever over the last couple days. Temp this morning is 98 but has been up to 100.5.  She also has had body aches and chest tightness. Has a 43 year old that got sick with the same symptoms starting March 15 th and she is still sick. . And the patient has also [place] [facility] [place] March 9th thur the 11 th. She and her family also have self quarantine themselves. Requesting an appointment to be tested. Advised that testing is being done in the hospital and will have to check with her provider to get tested. Pt also advised of having a virtual visit with the provider. She voice understanding. Flow at [facility] notified and call conference in.     . tReason for Disposition . MILD difficulty breathing (e.g., minimal/no SOB at rest, SOB with walking, pulse <100)  Answer Assessment - Initial Assessment Questions 1. ONSET: "When did the cough begin?"      Thursday 2. SEVERITY: "How bad is the cough today?"     Gotten worst 3. RESPIRATORY DISTRESS: "Describe your breathing."      Gets short of breath with exertion 4. FEVER: "Do you have a fever?" If so, ask: "What is your temperature, how was it measured, and when did it start?"     Had fever 100.5 on Thursday 5. HEMOPTYSIS: "Are you coughing up any blood?" If so ask: "How much?" (flecks, streaks, tablespoons, etc.)     no 6. TREATMENT: "What have you done so far to treat the cough?" (e.g., meds, fluids, humidifier)     no 7. CARDIAC HISTORY: "Do you have any history of heart disease?" (e.g., heart attack, congestive heart failure)      no 8. LUNG HISTORY: "Do you have any history of lung disease?"  (e.g., pulmonary embolus, asthma, emphysema)     no 9. PE RISK FACTORS: "Do you have a history of blood clots?" (or: recent major surgery, recent prolonged travel, bedridden)     no 10. OTHER SYMPTOMS: "Do you have any  other symptoms? (e.g., runny nose, wheezing, chest pain)       Runny nose, headache, chest tightness, lost voice, had sore throat, loss of appetite 11. PREGNANCY: "Is there any chance you are pregnant?" "When was your last menstrual period?"       No LMP pre menopause 12. TRAVEL: "Have you traveled out of the country in the last month?" (e.g., travel history, exposures)       [place], daughter also sick was not tested- same symptoms,  Answer Assessment - Initial Assessment Questions 1. COVID-19 DIAGNOSIS: "Who made your Coronavirus (COVID-19) diagnosis?" "Was it confirmed by a positive lab test?" If not diagnosed by a HCP, ask "Are there lots of cases (community spread) where you live?" (See public health department website, if unsure)   * MAJOR community spread: high number of cases; numbers of cases are increasing; many people hospitalized.   * MINOR community spread: low number of cases; not increasing; few or no people hospitalized     Not positive but daughter had same symptoms and is still sick, got sick on the March 15 th 2. ONSET: "When did the COVID-19 symptoms start?"      Thursday 3. WORST SYMPTOM: "What is your worst symptom?" (e.g., cough, fever, shortness of breath, muscle aches)  cough 4. COUGH: "How bad is the cough?"       Getting worst 5. FEVER: "Do you have a fever?" If so, ask: "What is your temperature, how was it measured, and when did it start?"     No fever now it is 98 was  100.5-99.5 6. RESPIRATORY STATUS: "Describe your breathing?" (e.g., shortness of breath, wheezing, unable to speak)      Shortness of breath with exertion 7. BETTER-SAME-WORSE: "Are you getting better, staying the same or getting worse compared to yesterday?"  If getting worse, ask, "In what way?"     Cough getting worst  8. HIGH RISK DISEASE: "Do you have any chronic medical problems?" (e.g., asthma, heart or lung disease, weak immune system, etc.)     no 9.  PREGNANCY: "Is there any chance you are pregnant?" "When was your last menstrual period?"     no 10. OTHER SYMPTOMS: "Do you have any other symptoms?"  (e.g., runny nose, headache, sore throat, loss of smell)       Runny nose, headache, sore throat, loss of appetite  Protocols used: CORONAVIRUS (COVID-19) DIAGNOSED OR SUSPECTED-A-AH, COUGH - ACUTE NON-PRODUCTIVE-A-AH

## 2019-01-05 NOTE — Telephone Encounter (Signed)
Cheryl Webster, contact patient to schedule virtual visit

## 2019-01-05 NOTE — Telephone Encounter (Signed)
Hailey schedule virtual visit with Dr. Ronne Binning or Dr. Jimmey Ralph

## 2019-01-05 NOTE — Telephone Encounter (Signed)
Spoke with patient, she has already seen Jarold Motto today with virtual. She has albuterol inhaler and wants to know at which point she should start to use the inhaler and how often she should use it. She does have a pulse ox available at home.

## 2019-01-12 ENCOUNTER — Ambulatory Visit (INDEPENDENT_AMBULATORY_CARE_PROVIDER_SITE_OTHER): Payer: No Typology Code available for payment source | Admitting: Licensed Clinical Social Worker

## 2019-01-12 DIAGNOSIS — F324 Major depressive disorder, single episode, in partial remission: Secondary | ICD-10-CM | POA: Diagnosis not present

## 2019-01-14 ENCOUNTER — Other Ambulatory Visit: Payer: Self-pay

## 2019-01-14 ENCOUNTER — Ambulatory Visit (INDEPENDENT_AMBULATORY_CARE_PROVIDER_SITE_OTHER): Payer: No Typology Code available for payment source | Admitting: Physician Assistant

## 2019-01-14 ENCOUNTER — Telehealth: Payer: Self-pay | Admitting: Family Medicine

## 2019-01-14 ENCOUNTER — Encounter (HOSPITAL_COMMUNITY): Payer: Self-pay

## 2019-01-14 ENCOUNTER — Emergency Department (HOSPITAL_COMMUNITY)
Admission: EM | Admit: 2019-01-14 | Discharge: 2019-01-14 | Disposition: A | Discharge status: Home / Self Care | Payer: No Typology Code available for payment source | Attending: Emergency Medicine | Admitting: Emergency Medicine

## 2019-01-14 ENCOUNTER — Encounter: Payer: Self-pay | Admitting: Physician Assistant

## 2019-01-14 ENCOUNTER — Emergency Department (HOSPITAL_COMMUNITY): Payer: No Typology Code available for payment source

## 2019-01-14 DIAGNOSIS — Z03818 Encounter for observation for suspected exposure to other biological agents ruled out: Secondary | ICD-10-CM | POA: Diagnosis not present

## 2019-01-14 DIAGNOSIS — J988 Other specified respiratory disorders: Secondary | ICD-10-CM | POA: Diagnosis not present

## 2019-01-14 DIAGNOSIS — Z79899 Other long term (current) drug therapy: Secondary | ICD-10-CM | POA: Insufficient documentation

## 2019-01-14 DIAGNOSIS — R05 Cough: Secondary | ICD-10-CM | POA: Diagnosis present

## 2019-01-14 DIAGNOSIS — B9789 Other viral agents as the cause of diseases classified elsewhere: Secondary | ICD-10-CM

## 2019-01-14 DIAGNOSIS — R059 Cough, unspecified: Secondary | ICD-10-CM

## 2019-01-14 MED ORDER — BENZONATATE 100 MG PO CAPS: 100.0000 mg | ORAL_CAPSULE | Freq: Three times a day (TID) | ORAL | 0 refills | Status: DC | PRN

## 2019-01-14 NOTE — ED Provider Notes (Signed)
[facility]-EMERGENCY DEPT Provider Note   CSN: [id number] Arrival date & time: 01/14/19  1213    History   Chief Complaint Chief Complaint  Patient presents with  . Cough  . Fever  . Shortness of Breath    HPI Cheryl Webster is a 43 y.o. female.     HPI  43 year old female sent by her PCP for chest x-ray.  She has had cough for about 2 weeks.  At first her daughter was ill with a viral-like illness but did not receive COVID-19 testing.  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  Now the patient has had cough for about 2 weeks.  Originally had fever and some aches but those have gone away.  The cough has gone from dry to mildly productive and she feels like something is stuck in her chest.  A little bit of chest tightness.  Was given albuterol inhaler by PCP last week.  The patient is short of breath whenever she has a long coughing spell or whenever she walks, earlier than she would typically.  However currently she is not short of breath.  PCP sent her here for evaluation of her lungs and chest x-ray.  No specific COVID contacts that she knows of.  She has tried Mucinex with dextromethorphan for cough.  Past Medical History:  Diagnosis Date  . Anxiety   . ASCUS of cervix with negative high risk HPV 11/2017  . Breast cyst right  . Frequent headaches     Patient Active Problem List   Diagnosis Date Noted  . GAD (generalized anxiety disorder), controlled with Prozac and counseling 08/22/2018  . Perimenopausal symptoms, on OCP, followed by Dr. Audie Box 08/22/2018  . ETD (Eustachian tube dysfunction), R > L, with Hx of tympanostomy tubes in 30s, resulted in pulsatile tinnitus 08/22/2018  . Dysuria, intermittent, with Hx of negative UCx 08/22/2018  . Pseudo-vegetarian diet, no red meat 08/22/2018  . 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 IUD REMOVAL    . TEMPOROMANDIBULAR JOINT SURGERY  AGE13  . THERAPEUTIC ABORTION  AGE 37, 21  . TONSILLECTOMY AND ADENOIDECTOMY  AGE 41  . TYMPANOSTOMY TUBE PLACEMENT  AGE 30s     OB History    Gravida  3   Para  1   Term      Preterm      AB  2   Living  1     SAB      TAB      Ectopic      Multiple      Live Births               Home Medications    Prior to Admission medications   Medication Sig Start Date End Date Taking? Authorizing Provider  albuterol (PROVENTIL HFA;VENTOLIN HFA) 108 (90 Base) MCG/ACT inhaler Inhale 1-2 puffs into the lungs every 6 (six) hours as needed for wheezing or shortness of breath. 01/05/19   Jarold Motto, PA  ALPRAZolam Prudy Feeler) 0.5 MG tablet Take 1 tablet (0.5 mg total) by mouth every 8 (eight) hours as needed for anxiety. 12/18/18   Fontaine, Nadyne Coombes, MD  benzonatate (TESSALON) 100 MG capsule Take 1 capsule (100 mg total) by mouth 3 (three) times daily as needed for cough. 01/14/19  Pricilla LovelessGoldston, Chrishawn Boley, MD  Cholecalciferol (VITAMIN D PO) Take by mouth.    [provider]  Cranberry Extract 200 MG CAPS Take by mouth.    [provider]  FLUoxetine (PROZAC) 10 MG capsule Take 1 capsule (10 mg total) by mouth daily. 12/07/18   Fontaine, Nadyne Coombesimothy P, MD  Lactobacillus (ACIDOPHILUS PO) Take by mouth.      [provider]  magnesium oxide (MAG-OX) 400 MG tablet Take 400 mg by mouth daily.    [provider]  Multiple Vitamins-Iron (MULTIVITAMIN/IRON PO) Take by mouth.      [provider]  norethindrone-ethinyl estradiol (JUNEL 1/20) 1-20 MG-MCG tablet TAKE ONE ACTIVE PILL EVERY DAY CONTINUOUSLY AS DIRECTED. 12/07/18   Fontaine, Nadyne Coombesimothy P, MD  vitamin B-12 (CYANOCOBALAMIN) 1000 MCG tablet Take 1,000 mcg by mouth daily.    [provider]    Family History Family History  Problem Relation Age of Onset  .  Hypertension Father   . Hyperlipidemia Father   . Breast cancer Maternal Grandmother        Postmenopausal  . Rectal cancer Maternal Grandmother   . Heart disease Paternal Grandmother   . Heart failure Paternal Grandmother     Social History Social History   Tobacco Use  . Smoking status: Never Smoker  . Smokeless tobacco: Never Used  Substance Use Topics  . Alcohol use: Yes    Alcohol/week: 0.0 standard drinks    Comment: Rare  . Drug use: Yes    Types: Marijuana     Allergies   Aleve [naproxen sodium]; Bee venom; and Sulfa antibiotics   Review of Systems Review of Systems  Constitutional: Negative for fever.  Respiratory: Positive for cough, chest tightness and shortness of breath.   Cardiovascular: Negative for chest pain.  All other systems reviewed and are negative.    Physical Exam Updated Vital Signs BP 126/69 (BP Location: Left Arm)   Pulse 64   Temp 98 F (36.7 C) (Oral)   Resp 16   Ht 5\' 7"  (1.702 m)   Wt 68 kg   SpO2 100%   BMI 23.49 kg/m   Physical Exam Vitals signs and nursing note reviewed.  Constitutional:      General: She is not in acute distress.    Appearance: She is well-developed. She is not ill-appearing or diaphoretic.  HENT:     Head: Normocephalic and atraumatic.     Right Ear: External ear normal.     Left Ear: External ear normal.     Nose: Nose normal.  Eyes:     General:        Right eye: No discharge.        Left eye: No discharge.  Cardiovascular:     Rate and Rhythm: Normal rate and regular rhythm.     Heart sounds: Normal heart sounds.  Pulmonary:     Effort: Pulmonary effort is normal. No tachypnea, accessory muscle usage or respiratory distress.     Breath sounds: Normal breath sounds. No decreased breath sounds, wheezing, rhonchi or rales.     Comments: Speaks in complete sentences.  No respiratory distress.  Intermittent coughing. Abdominal:     General: There is no distension.  Skin:    General: Skin is  warm and dry.  Neurological:     Mental Status: She is alert.  Psychiatric:        Mood and Affect: Mood is not anxious.      ED Treatments / Results  Labs (all labs ordered are listed, but only abnormal results are displayed) Labs Reviewed - No data to display  EKG None  Radiology Dg Chest Portable 1 View  Result Date: 01/14/2019 CLINICAL DATA:  Cough and shortness of breath EXAM: PORTABLE CHEST 1 VIEW COMPARISON:  October 10, 2017 FINDINGS: No edema or consolidation. There is minimal atelectatic change in the lateral right base. Heart size and pulmonary vascularity are normal. No adenopathy. There is midthoracic dextroscoliosis. IMPRESSION: Minimal atelectasis in lateral right base. Lungs elsewhere clear. Heart size within normal limits. Scoliosis present. Electronically Signed   By: Bretta Bang III M.D.   On: 01/14/2019 13:13    Procedures Procedures (including critical care time)  Medications Ordered in ED Medications - No data to display   Initial Impression / Assessment and Plan / ED Course  I have reviewed the triage vital signs and the nursing notes.  Pertinent labs & imaging results that were available during my care of the patient were reviewed by me and considered in my medical decision making (see chart for details).        Patient appears well.  She does not appear to have pneumonia based on exam or chest x-ray.  I do not think antibiotics will be beneficial.  She could have lingering cough from previous viral illness, continued illness, or COVID-19.  Either way, she is appearing well here and does not appear to need oxygen support or an acute admission.  I will prescribe Tessalon to help with her cough and advised her to quarantine at home.  We discussed return precautions.  Cheryl Webster was evaluated in Emergency Department on 01/14/2019 for the symptoms described in the history of present illness. She was evaluated in the context of the global COVID-19  pandemic, which necessitated consideration that the patient might be at risk for infection with the SARS-CoV-2 virus that causes COVID-19. Institutional protocols and algorithms that pertain to the evaluation of patients at risk for COVID-19 are in a state of rapid change based on information released by regulatory bodies including the CDC and federal and state organizations. These policies and algorithms were followed during the patient's care in the ED.   Final Clinical Impressions(s) / ED Diagnoses   Final diagnoses:  Viral respiratory infection    ED Discharge Orders         Ordered    benzonatate (TESSALON) 100 MG capsule  3 times daily PRN     01/14/19 1323           Pricilla Loveless, MD 01/14/19 1326

## 2019-01-14 NOTE — Telephone Encounter (Signed)
Left message on voicemail to call office.  

## 2019-01-14 NOTE — Telephone Encounter (Signed)
Pt called back, she said her cough is worse having coughing spells and expectorating light yellow sputum. Pt has been using inhaler every few hours the past couple days. Also using Mucinex Max mainly at night and sometimes during the day. Pt is having some SOB pulse ox is 96 % today, HR 56. Pt denies chest pain and fever.

## 2019-01-14 NOTE — ED Notes (Signed)
X-ray at bedside

## 2019-01-14 NOTE — Telephone Encounter (Signed)
See note, patient was seen 4/7 by Jarold Motto, PA-C  virtually.   Copied from CRM [id number]. Topic: General - Other >> Jan 14, 2019  8:47 AM Tamela Oddi wrote: Reason for CRM: Patient called to inform the doctor that her condition has not gotten better since her last appt.  Patient stated that Dr. Bufford Buttner told her to call if there was no improvement.  Please advise and call patient back to let her know what should be done next.  CB# [phone]

## 2019-01-14 NOTE — Discharge Instructions (Signed)
If you develop blood in your cough, trouble breathing, or any other new/concerning symptoms then return to the ER or see your doctor.  We are presuming that you have been infected with a COVID-19 infection, and asking that you self quarantine for at least 7 days and you must be symptom-free for at least 3 days.     Person Under Monitoring Name: Cheryl Webster  Location: [address] [place] [address]   Infection Prevention Recommendations for Individuals Confirmed to have, or Being Evaluated for, 2019 Novel Coronavirus (COVID-19) Infection Who Receive Care at Home  Individuals who are confirmed to have, or are being evaluated for, COVID-19 should follow the prevention steps below until a healthcare provider or local or state health department says they can return to normal activities.  Stay home except to get medical care You should restrict activities outside your home, except for getting medical care. Do not go to work, school, or public areas, and do not use public transportation or taxis.  Call ahead before visiting your doctor Before your medical appointment, call the healthcare provider and tell them that you have, or are being evaluated for, COVID-19 infection. This will help the healthcare providers office take steps to keep other people from getting infected. Ask your healthcare provider to call the local or state health department.  Monitor your symptoms Seek prompt medical attention if your illness is worsening (e.g., difficulty breathing). Before going to your medical appointment, call the healthcare provider and tell them that you have, or are being evaluated for, COVID-19 infection. Ask your healthcare provider to call the local or state health department.  Wear a facemask You should wear a facemask that covers your nose and mouth when you are in the same room with other people and when you visit a healthcare provider. People who live with or visit you should  also wear a facemask while they are in the same room with you.  Separate yourself from other people in your home As much as possible, you should stay in a different room from other people in your home. Also, you should use a separate bathroom, if available.  Avoid sharing household items You should not share dishes, drinking glasses, cups, eating utensils, towels, bedding, or other items with other people in your home. After using these items, you should wash them thoroughly with soap and water.  Cover your coughs and sneezes Cover your mouth and nose with a tissue when you cough or sneeze, or you can cough or sneeze into your sleeve. Throw used tissues in a lined trash can, and immediately wash your hands with soap and water for at least 20 seconds or use an alcohol-based hand rub.  Wash your [employer] your hands often and thoroughly with soap and water for at least 20 seconds. You can use an alcohol-based hand sanitizer if soap and water are not available and if your hands are not visibly dirty. Avoid touching your eyes, nose, and mouth with unwashed hands.   Prevention Steps for Caregivers and Household Members of Individuals Confirmed to have, or Being Evaluated for, COVID-19 Infection Being Cared for in the Home  If you live with, or provide care at home for, a person confirmed to have, or being evaluated for, COVID-19 infection please follow these guidelines to prevent infection:  Follow healthcare providers instructions Make sure that you understand and can help the patient follow any healthcare provider instructions for all care.  Provide for the patients basic needs You  should help the patient with basic needs in the home and provide support for getting groceries, prescriptions, and other personal needs.  Monitor the patients symptoms If they are getting sicker, call his or her medical provider and tell them that the patient has, or is being evaluated for, COVID-19  infection. This will help the healthcare providers office take steps to keep other people from getting infected. Ask the healthcare provider to call the local or state health department.  Limit the number of people who have contact with the patient If possible, have only one caregiver for the patient. Other household members should stay in another home or place of residence. If this is not possible, they should stay in another room, or be separated from the patient as much as possible. Use a separate bathroom, if available. Restrict visitors who do not have an essential need to be in the home.  Keep older adults, very young children, and other sick people away from the patient Keep older adults, very young children, and those who have compromised immune systems or chronic health conditions away from the patient. This includes people with chronic heart, lung, or kidney conditions, diabetes, and cancer.  Ensure good ventilation Make sure that shared spaces in the home have good air flow, such as from an air conditioner or an opened window, weather permitting.  Wash your hands often Wash your hands often and thoroughly with soap and water for at least 20 seconds. You can use an alcohol based hand sanitizer if soap and water are not available and if your hands are not visibly dirty. Avoid touching your eyes, nose, and mouth with unwashed hands. Use disposable paper towels to dry your hands. If not available, use dedicated cloth towels and replace them when they become wet.  Wear a facemask and gloves Wear a disposable facemask at all times in the room and gloves when you touch or have contact with the patients blood, body fluids, and/or secretions or excretions, such as sweat, saliva, sputum, nasal mucus, vomit, urine, or feces.  Ensure the mask fits over your nose and mouth tightly, and do not touch it during use. Throw out disposable facemasks and gloves after using them. Do not reuse. Wash  your hands immediately after removing your facemask and gloves. If your personal clothing becomes contaminated, carefully remove clothing and launder. Wash your hands after handling contaminated clothing. Place all used disposable facemasks, gloves, and other waste in a lined container before disposing them with other household waste. Remove gloves and wash your hands immediately after handling these items.  Do not share dishes, glasses, or other household items with the patient Avoid sharing household items. You should not share dishes, drinking glasses, cups, eating utensils, towels, bedding, or other items with a patient who is confirmed to have, or being evaluated for, COVID-19 infection. After the person uses these items, you should wash them thoroughly with soap and water.  Wash laundry thoroughly Immediately remove and wash clothes or bedding that have blood, body fluids, and/or secretions or excretions, such as sweat, saliva, sputum, nasal mucus, vomit, urine, or feces, on them. Wear gloves when handling laundry from the patient. Read and follow directions on labels of laundry or clothing items and detergent. In general, wash and dry with the warmest temperatures recommended on the label.  Clean all areas the individual has used often Clean all touchable surfaces, such as counters, tabletops, doorknobs, bathroom fixtures, toilets, phones, keyboards, tablets, and bedside tables, every day. Also, clean  any surfaces that may have blood, body fluids, and/or secretions or excretions on them. Wear gloves when cleaning surfaces the patient has come in contact with. Use a diluted bleach solution (e.g., dilute bleach with 1 part bleach and 10 parts water) or a household disinfectant with a label that says EPA-registered for coronaviruses. To make a bleach solution at home, add 1 tablespoon of bleach to 1 quart (4 cups) of water. For a larger supply, add  cup of bleach to 1 gallon (16 cups) of  water. Read labels of cleaning products and follow recommendations provided on product labels. Labels contain instructions for safe and effective use of the cleaning product including precautions you should take when applying the product, such as wearing gloves or eye protection and making sure you have good ventilation during use of the product. Remove gloves and wash hands immediately after cleaning.  Monitor yourself for signs and symptoms of illness Caregivers and household members are considered close contacts, should monitor their health, and will be asked to limit movement outside of the home to the extent possible. Follow the monitoring steps for close contacts listed on the symptom monitoring form.   ? If you have additional questions, contact your local health department or call the epidemiologist on call at [phone] (available 24/7). ? This guidance is subject to change. For the most up-to-date guidance from [facility], please refer to their website: YouBlogs.pl

## 2019-01-14 NOTE — Telephone Encounter (Signed)
Appointment scheduled for today at 11:00 with [facility].

## 2019-01-14 NOTE — ED Triage Notes (Signed)
Pt sent from primary care office for covid testing and chest xray. Pt reports cough, fatigue, fever x2 weeks. Pt currently afebrile. Pt reports taking mucinex, tylenol, and inhaler.

## 2019-01-14 NOTE — Progress Notes (Signed)
Virtual Visit via Video   I connected with Cheryl Webster on 01/14/19 at 11:00 AM EDT by a video enabled telemedicine application and verified that I am speaking with the correct person using two identifiers. Location patient: Home Location provider: [facility], Office Persons participating in the virtual visit: Cheryl Webster, Jarold MottoSamantha Caylor [place], [place]   I discussed the limitations of evaluation and management by telemedicine and the availability of in person appointments. The patient expressed understanding and agreed to proceed.  Subjective:   HPI:  Cough She was seen virtually by me on 01/05/2019 and dx with "Suspected Covid-19 Virus Infection" - please see encounter for more information.  Pt said her cough is worse since last visit on 4/7 -- having coughing spells and expectorating light yellow sputum. Pt has been using inhaler every few hours the past couple days. Also using Mucinex Max mainly at night and sometimes during the day. Pt is having some SOB pulse ox is 96 % today, HR 56. Pt denies chest pain and fever. She states that she felt like she was getting better and then worse. She is having increased fatigue.  ROS: See pertinent positives and negatives per HPI.  Patient Active Problem List   Diagnosis Date Noted  . GAD (generalized anxiety disorder), controlled with Prozac and counseling 08/22/2018  . Perimenopausal symptoms, on OCP, followed by Dr. Audie BoxFontaine 08/22/2018  . ETD (Eustachian tube dysfunction), R > L, with Hx of tympanostomy tubes in 30s, resulted in pulsatile tinnitus 08/22/2018  . Dysuria, intermittent, with Hx of negative UCx 08/22/2018  . Pseudo-vegetarian diet, no red meat 08/22/2018  . 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 Smoking status: Never Smoker  . Smokeless tobacco: Never Used  Substance Use Topics  . Alcohol use: Yes   Alcohol/week: 0.0 standard drinks    Comment: Rare    Current Outpatient Medications:  .  albuterol (PROVENTIL HFA;VENTOLIN HFA) 108 (90 Base) MCG/ACT inhaler, Inhale 1-2 puffs into the lungs every 6 (six) hours as needed for wheezing or shortness of breath., Disp: 1 Inhaler, Rfl: 1 .  ALPRAZolam (XANAX) 0.5 MG tablet, Take 1 tablet (0.5 mg total) by mouth every 8 (eight) hours as needed for anxiety., Disp: 30 tablet, Rfl: 1 .  Cholecalciferol (VITAMIN D PO), Take by mouth., Disp: , Rfl:  .  Cranberry Extract 200 MG CAPS, Take by mouth., Disp: , Rfl:  .  FLUoxetine (PROZAC) 10 MG capsule, Take 1 capsule (10 mg total) by mouth daily., Disp: 30 capsule, Rfl: 12 .  Lactobacillus (ACIDOPHILUS PO), Take by mouth.  , Disp: , Rfl:  .  magnesium oxide (MAG-OX) 400 MG tablet, Take 400 mg by mouth daily., Disp: , Rfl:  .  Multiple Vitamins-Iron (MULTIVITAMIN/IRON PO), Take by mouth.  , Disp: , Rfl:  .  norethindrone-ethinyl estradiol (JUNEL 1/20) 1-20 MG-MCG tablet, TAKE ONE ACTIVE PILL EVERY DAY CONTINUOUSLY AS DIRECTED., Disp: 63 tablet, Rfl: 4 .  vitamin B-12 (CYANOCOBALAMIN) 1000 MCG tablet, Take 1,000 mcg by mouth daily., Disp: , Rfl:   Allergies  Allergen Reactions  . Aleve [Naproxen Sodium]     It was the Aleve cold and sinus therefore patient doesn't take any aleve  . Bee Venom Swelling  . Sulfa Antibiotics Hives    Objective:   VITALS: Per patient if applicable, see vitals. GENERAL: Alert, appears well and in no  acute distress. HEENT: Atraumatic, conjunctiva clear, no obvious abnormalities on inspection of external nose and ears. NECK: Normal movements of the head and neck. CARDIOPULMONARY: No increased WOB. Speaking in clear sentences. I:E ratio WNL.  MS: Moves all visible extremities without noticeable abnormality. PSYCH: Pleasant and cooperative, well-groomed. Speech normal rate and rhythm. Affect is appropriate. Insight and judgement are appropriate. Attention is focused, linear,  and appropriate.  NEURO: CN grossly intact. Oriented as arrived to appointment on time with no prompting. Moves both UE equally.  SKIN: No obvious lesions, wounds, erythema, or cyanosis noted on face or hands.  Assessment and Plan:   Diagnoses and all orders for this visit:  Cough   Discussed options. I do think that she would benefit from a lung exam and possible CXR. Will send to ER vs UC for further evaluation and treatment. She opted for [facility] ED after we looked at options. Will defer to ED for further testing, interventions, etc.  . Reviewed expectations re: course of current medical issues. . Discussed self-management of symptoms. . Outlined signs and symptoms indicating need for more acute intervention. . Patient verbalized understanding and all questions were answered. Marland Kitchen Health Maintenance issues including appropriate healthy diet, exercise, and smoking avoidance were discussed with patient. . See orders for this visit as documented in the electronic medical record.  I discussed the assessment and treatment plan with the patient. The patient was provided an opportunity to ask questions and all were answered. The patient agreed with the plan and demonstrated an understanding of the instructions.   The patient was advised to call back or seek an in-person evaluation if the symptoms worsen or if the condition fails to improve as anticipated.   Webster, Cheryl 01/14/2019

## 2019-01-15 ENCOUNTER — Other Ambulatory Visit: Payer: Self-pay

## 2019-01-15 MED ORDER — AZITHROMYCIN 250 MG PO TABS: ORAL_TABLET | ORAL | 0 refills | Status: DC

## 2019-01-28 ENCOUNTER — Telehealth: Payer: Self-pay | Admitting: Family Medicine

## 2019-01-28 NOTE — Telephone Encounter (Signed)
Copied from CRM [id number]. Topic: Quick Communication - See Telephone Encounter >> Jan 28, 2019 10:18 AM Fanny Bien wrote: CRM for notification. See Telephone encounter for: 01/28/19. Pt called and stated that she has been treating covid-19 symptoms and now has thrush. Pt would like a call back regarding.

## 2019-01-28 NOTE — Telephone Encounter (Signed)
App made to see Dr. Earlene Plater Friday

## 2019-01-28 NOTE — Telephone Encounter (Signed)
Patient was seen 4/16, please advise.

## 2019-01-29 ENCOUNTER — Other Ambulatory Visit: Payer: Self-pay

## 2019-01-29 ENCOUNTER — Ambulatory Visit (INDEPENDENT_AMBULATORY_CARE_PROVIDER_SITE_OTHER): Payer: No Typology Code available for payment source | Admitting: Family Medicine

## 2019-01-29 ENCOUNTER — Encounter: Payer: Self-pay | Admitting: Family Medicine

## 2019-01-29 DIAGNOSIS — Z7189 Other specified counseling: Secondary | ICD-10-CM | POA: Diagnosis not present

## 2019-01-29 DIAGNOSIS — B370 Candidal stomatitis: Secondary | ICD-10-CM

## 2019-01-29 DIAGNOSIS — R6889 Other general symptoms and signs: Secondary | ICD-10-CM | POA: Diagnosis not present

## 2019-01-29 DIAGNOSIS — Z20822 Contact with and (suspected) exposure to covid-19: Secondary | ICD-10-CM

## 2019-01-29 MED ORDER — NYSTATIN 100000 UNIT/ML MT SUSP: 5.0000 mL | Freq: Four times a day (QID) | OROMUCOSAL | 0 refills | Status: DC

## 2019-01-29 MED ORDER — FLUCONAZOLE 150 MG PO TABS: 150.0000 mg | ORAL_TABLET | Freq: Once | ORAL | 0 refills | Status: AC

## 2019-01-29 NOTE — Progress Notes (Signed)
Virtual Visit via Video   Due to the COVID-19 pandemic, this visit was completed with telemedicine (audio/video) technology to reduce patient and provider exposure as well as to preserve personal protective equipment.   I connected with Cheryl Webster by a video enabled telemedicine application and verified that I am speaking with the correct person using two identifiers. Location patient: Home Location provider: [facility], Office Persons participating in the virtual visit: Cheryl Webster, Cheryl RimaErica Emylie Amster, DO Barnie MortJoEllen Thompson, CMA acting as scribe for Dr. Helane RimaErica Francessca Friis.   I discussed the limitations of evaluation and management by telemedicine and the availability of in person appointments. The patient expressed understanding and agreed to proceed.  Care Team   Patient Care Team: Cheryl Webster, Cheryl Stantz, DO as PCP - General (Family Medicine)  Subjective:   HPI: Patient has been seen for Covid 19. She has developed raw sore tongue with white spots in mouth after antibiotics. She has had fever yesterday. Throat also feel sore with swollen nodes.  Daughter noted to have similar symptoms that lasted about a week.  She continues to have coughing fits.  She has limited exercise due to her lung issues.  Review of Systems  Constitutional: Positive for fever. Negative for chills.       Yesterday   HENT: Negative.   Eyes: Negative.   Respiratory: Positive for cough and shortness of breath.   Cardiovascular: Negative.   Gastrointestinal: Negative.   Genitourinary: Negative.   Musculoskeletal: Negative.   Neurological: Negative.   Endo/Heme/Allergies: Negative.   Psychiatric/Behavioral: Negative.      Patient Active Problem List   Diagnosis Date Noted  . GAD (generalized anxiety disorder), controlled with Prozac and counseling 08/22/2018  . Perimenopausal symptoms, on OCP, followed by Dr. Audie BoxFontaine 08/22/2018  . ETD (Eustachian tube dysfunction), R > L, with Hx of tympanostomy tubes in 30s,  resulted in pulsatile tinnitus 08/22/2018  . Dysuria, intermittent, with Hx of negative UCx 08/22/2018  . Pseudo-vegetarian diet, no red meat 08/22/2018  . 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 Smoking status: Never Smoker  . Smokeless tobacco: Never Used  Substance Use Topics  . Alcohol use: Yes    Alcohol/week: 0.0 standard drinks    Comment: Rare    Current Outpatient Medications:  .  ALPRAZolam (XANAX) 0.5 MG tablet, Take 1 tablet (0.5 mg total) by mouth every 8 (eight) hours as needed for anxiety., Disp: 30 tablet, Rfl: 1 .  Cholecalciferol (VITAMIN D PO), Take by mouth., Disp: , Rfl:  .  Cranberry Extract 200 MG CAPS, Take by mouth., Disp: , Rfl:  .  FLUoxetine (PROZAC) 10 MG capsule, Take 1 capsule (10 mg total) by mouth daily., Disp: 30 capsule, Rfl: 12 .  Lactobacillus (ACIDOPHILUS PO), Take by mouth.  , Disp: , Rfl:  .  magnesium oxide (MAG-OX) 400 MG tablet, Take 400 mg by mouth daily., Disp: , Rfl:  .  Multiple Vitamins-Iron (MULTIVITAMIN/IRON PO), Take by mouth.  , Disp: , Rfl:  .  norethindrone-ethinyl estradiol (JUNEL 1/20) 1-20 MG-MCG tablet, TAKE ONE ACTIVE PILL EVERY DAY CONTINUOUSLY AS DIRECTED., Disp: 63 tablet, Rfl: 4 .  vitamin B-12 (CYANOCOBALAMIN) 1000 MCG tablet, Take 1,000 mcg by mouth daily., Disp: , Rfl:  .  nystatin (MYCOSTATIN) 100000 UNIT/ML suspension, Take 5 mLs (500,000 Units total) by mouth 4 (four) times daily., Disp: 60 mL, Rfl: 0  Allergies  Allergen Reactions  . Aleve [Naproxen Sodium]     It was the Aleve cold and sinus therefore patient doesn't take any aleve  . Bee Venom Swelling  . Sulfa Antibiotics Hives    Objective:   VITALS: Per patient if applicable, see vitals. GENERAL: Alert, appears well and in no acute distress. HEENT: Atraumatic, conjunctiva clear, no obvious abnormalities on inspection of external nose  and ears. Thrush. NECK: Normal movements of the head and neck. CARDIOPULMONARY: No increased WOB. Speaking in clear sentences. Cough. MS: Moves all visible extremities without noticeable abnormality. PSYCH: Pleasant and cooperative, well-groomed. Speech normal rate and rhythm. Affect is appropriate. Insight and judgement are appropriate. Attention is focused, linear, and appropriate.  NEURO: CN grossly intact. Oriented as arrived to appointment on time with no prompting. Moves both UE equally.  SKIN: No obvious lesions, wounds, erythema, or cyanosis noted on face or hands.  Depression screen [facility] 2/9 08/21/2018  Decreased Interest 0  Down, Depressed, Hopeless 0  PHQ - 2 Score 0  Altered sleeping 0  Tired, decreased energy 0  Change in appetite 0  Feeling bad or failure about yourself  0  Trouble concentrating 0  Moving slowly or fidgety/restless 0  Suicidal thoughts 0  PHQ-9 Score 0  Difficult doing work/chores Not difficult at all    Assessment and Plan:   Khusbu was seen today for sore throat.  Diagnoses and all orders for this visit:  Thrush Comments: New.  Started after antibiotics.  Will treat as below. Orders: -     fluconazole (DIFLUCAN) 150 MG tablet; Take 1 tablet (150 mg total) by mouth once for 1 dose. -     nystatin (MYCOSTATIN) 100000 UNIT/ML suspension; Take 5 mLs (500,000 Units total) by mouth 4 (four) times daily.  Suspected Covid-19 Virus Infection Comments: Symptoms still concerning for COVID-19.  Reviewed cautions and red flags.  Educated About Covid-19 Virus Infection    . COVID-19 Education: The signs and symptoms of COVID-19 were discussed with the patient and how to seek care for testing if needed. The importance of social distancing was discussed today. . Reviewed expectations re: course of current medical issues. . Discussed self-management of symptoms. . Outlined signs and symptoms indicating need for more acute intervention. . Patient  verbalized understanding and all questions were answered. Marland Kitchen Health Maintenance issues including appropriate healthy diet, exercise, and smoking avoidance were discussed with patient. . See orders for this visit as documented in the electronic medical record.  Cheryl Rima, DO  Records requested if needed. Time spent: 25 minutes, of which >50% was spent in obtaining information about her symptoms, reviewing her previous labs, evaluations, and treatments, counseling her about her condition (please see the discussed topics above), and developing a plan to further investigate it; she had a number of questions which I addressed.

## 2019-01-30 ENCOUNTER — Encounter: Payer: Self-pay | Admitting: Family Medicine

## 2019-01-30 NOTE — Patient Instructions (Signed)

## 2019-02-04 ENCOUNTER — Other Ambulatory Visit: Payer: Self-pay

## 2019-02-04 MED ORDER — FLUOXETINE HCL 10 MG PO CAPS: 10.0000 mg | ORAL_CAPSULE | Freq: Every day | ORAL | 4 refills | Status: DC

## 2019-02-09 ENCOUNTER — Ambulatory Visit (INDEPENDENT_AMBULATORY_CARE_PROVIDER_SITE_OTHER): Payer: No Typology Code available for payment source | Admitting: Licensed Clinical Social Worker

## 2019-02-09 ENCOUNTER — Other Ambulatory Visit: Payer: Self-pay

## 2019-02-09 DIAGNOSIS — F324 Major depressive disorder, single episode, in partial remission: Secondary | ICD-10-CM

## 2019-02-09 MED ORDER — NORETHINDRONE ACET-ETHINYL EST 1-20 MG-MCG PO TABS: ORAL_TABLET | ORAL | 4 refills | Status: DC

## 2019-03-11 ENCOUNTER — Ambulatory Visit: Payer: No Typology Code available for payment source | Admitting: Licensed Clinical Social Worker

## 2019-03-23 ENCOUNTER — Ambulatory Visit (INDEPENDENT_AMBULATORY_CARE_PROVIDER_SITE_OTHER): Payer: PRIVATE HEALTH INSURANCE | Admitting: Licensed Clinical Social Worker

## 2019-03-23 DIAGNOSIS — F324 Major depressive disorder, single episode, in partial remission: Secondary | ICD-10-CM

## 2019-03-29 ENCOUNTER — Ambulatory Visit
Admission: RE | Admit: 2019-03-29 | Discharge: 2019-03-29 | Disposition: A | Discharge status: Home / Self Care | Payer: PRIVATE HEALTH INSURANCE | Source: Ambulatory Visit | Attending: Gynecology | Admitting: Gynecology

## 2019-03-29 ENCOUNTER — Other Ambulatory Visit: Payer: Self-pay

## 2019-03-29 DIAGNOSIS — Z1231 Encounter for screening mammogram for malignant neoplasm of breast: Secondary | ICD-10-CM

## 2019-03-31 ENCOUNTER — Other Ambulatory Visit: Payer: Self-pay | Admitting: Gynecology

## 2019-03-31 DIAGNOSIS — R928 Other abnormal and inconclusive findings on diagnostic imaging of breast: Secondary | ICD-10-CM

## 2019-04-13 ENCOUNTER — Other Ambulatory Visit: Payer: Self-pay | Admitting: Gynecology

## 2019-04-13 ENCOUNTER — Other Ambulatory Visit: Payer: Self-pay

## 2019-04-13 ENCOUNTER — Ambulatory Visit
Admission: RE | Admit: 2019-04-13 | Discharge: 2019-04-13 | Disposition: A | Discharge status: Home / Self Care | Payer: PRIVATE HEALTH INSURANCE | Source: Ambulatory Visit | Attending: Gynecology | Admitting: Gynecology

## 2019-04-13 DIAGNOSIS — R928 Other abnormal and inconclusive findings on diagnostic imaging of breast: Secondary | ICD-10-CM

## 2019-04-13 DIAGNOSIS — N6489 Other specified disorders of breast: Secondary | ICD-10-CM

## 2019-04-20 ENCOUNTER — Ambulatory Visit (INDEPENDENT_AMBULATORY_CARE_PROVIDER_SITE_OTHER): Payer: PRIVATE HEALTH INSURANCE | Admitting: Licensed Clinical Social Worker

## 2019-04-20 DIAGNOSIS — F324 Major depressive disorder, single episode, in partial remission: Secondary | ICD-10-CM

## 2019-05-19 ENCOUNTER — Ambulatory Visit (INDEPENDENT_AMBULATORY_CARE_PROVIDER_SITE_OTHER): Payer: PRIVATE HEALTH INSURANCE | Admitting: Licensed Clinical Social Worker

## 2019-05-19 DIAGNOSIS — F324 Major depressive disorder, single episode, in partial remission: Secondary | ICD-10-CM | POA: Diagnosis not present

## 2019-06-16 ENCOUNTER — Ambulatory Visit (INDEPENDENT_AMBULATORY_CARE_PROVIDER_SITE_OTHER): Payer: PRIVATE HEALTH INSURANCE | Admitting: Licensed Clinical Social Worker

## 2019-06-16 DIAGNOSIS — F324 Major depressive disorder, single episode, in partial remission: Secondary | ICD-10-CM

## 2019-06-22 ENCOUNTER — Encounter: Payer: Self-pay | Admitting: Gynecology

## 2019-07-14 ENCOUNTER — Ambulatory Visit: Payer: PRIVATE HEALTH INSURANCE | Admitting: Licensed Clinical Social Worker

## 2019-07-23 ENCOUNTER — Encounter: Payer: Self-pay | Admitting: Family Medicine

## 2019-07-23 ENCOUNTER — Ambulatory Visit (INDEPENDENT_AMBULATORY_CARE_PROVIDER_SITE_OTHER): Payer: PRIVATE HEALTH INSURANCE | Admitting: Family Medicine

## 2019-07-23 ENCOUNTER — Other Ambulatory Visit: Payer: Self-pay

## 2019-07-23 VITALS — BP 115/64 | HR 59 | Temp 98.1°F | Ht 67.0 in | Wt 155.2 lb

## 2019-07-23 DIAGNOSIS — H6981 Other specified disorders of Eustachian tube, right ear: Secondary | ICD-10-CM | POA: Diagnosis not present

## 2019-07-23 MED ORDER — AZELASTINE-FLUTICASONE 137-50 MCG/ACT NA SUSP: 1.0000 | Freq: Two times a day (BID) | NASAL | 1 refills | Status: DC

## 2019-07-23 NOTE — Progress Notes (Signed)
Patient: Cheryl Webster MRN: [id number] DOB: [birth year] PCP: Briscoe Deutscher, DO     Subjective:  Chief Complaint  Patient presents with  . R ear pain    HPI: The patient is a 43 y.o. female who presents today for pain in R ear, fullness and a feeling of being able to hear her pulse in her ear. H/O multiple ear infections. She states she has felt liek she has needed to have wax removed for a month and then last night woke up with fullness, pressure and pain in her right ear. She also has eustachian tube dysfunction. She does use nasal spray. No fever/chills, no drainage from ear.   Review of Systems  Constitutional: Negative for fatigue.  HENT: Positive for ear pain and sneezing. Negative for congestion, postnasal drip, rhinorrhea, sinus pressure, sinus pain and sore throat.        C/o R ear pain  Also has h/o sesonal allergies  Respiratory: Negative for cough and shortness of breath.   Cardiovascular: Negative for chest pain.  Gastrointestinal: Negative for nausea.  Musculoskeletal: Negative for neck pain and neck stiffness.  Neurological: Negative for dizziness and headaches.    Allergies Patient is allergic to bee venom and sulfa antibiotics.  Past Medical History Patient  has a past medical history of Anxiety, ASCUS of cervix with negative high risk HPV (11/2017), Breast cyst (right), and Frequent headaches.  Surgical History Patient  has a past surgical history that includes Temporomandibular joint surgery (AGE13); Therapeutic abortion (AGE 73, 44); Tympanostomy tube placement (AGE 33s); IUD removal; and Tonsillectomy and adenoidectomy (AGE 44).  Family History Pateint's family history includes Breast cancer in her maternal grandmother; Heart disease in her paternal grandmother; Heart failure in her paternal grandmother; Hyperlipidemia in her father; Hypertension in her father; Rectal cancer in her maternal grandmother.  Social History Patient  reports that she has  never smoked. She has never used smokeless tobacco. She reports current alcohol use. She reports current drug use. Drug: Marijuana.    Objective: Vitals:   07/23/19 1455  BP: 115/64  Pulse: (!) 59  Temp: 98.1 F (36.7 C)  TempSrc: Skin  SpO2: 98%  Weight: 155 lb 3.2 oz (70.4 kg)  Height: 5\' 7"  (1.702 m)    Body mass index is 24.31 kg/m.  Physical Exam Vitals signs reviewed.  Constitutional:      Appearance: Normal appearance.  HENT:     Head: Normocephalic and atraumatic.     Right Ear: Tympanic membrane, ear canal and external ear normal.     Left Ear: Tympanic membrane, ear canal and external ear normal.     Mouth/Throat:     Mouth: Mucous membranes are moist.  Eyes:     Extraocular Movements: Extraocular movements intact.     Conjunctiva/sclera: Conjunctivae normal.     Pupils: Pupils are equal, round, and reactive to light.  Neck:     Musculoskeletal: Normal range of motion and neck supple.  Cardiovascular:     Rate and Rhythm: Normal rate and regular rhythm.     Heart sounds: Normal heart sounds.  Pulmonary:     Effort: Pulmonary effort is normal.     Breath sounds: Normal breath sounds.  Abdominal:     General: Abdomen is flat. Bowel sounds are normal.     Palpations: Abdomen is soft.  Neurological:     General: No focal deficit present.     Mental Status: She is alert and oriented to person, place, and time.  I personally flushed ear and used curette to remove some wax. Not impacted and tm visualized.      Assessment/plan: 1. Eustachian tube dysfunction, right TM pearly with no signs of infection. Changing her to dymista instead of flonase. Continue zyrtec. She felt better after I flushed her ear and removed some wax, could continue at home as she has just a slight bit of wax right at outer part of canal. If not better let us know.    Return if symptoms worsen or fail to improve.    Orland Mustard, MD [facility]    07/23/2019

## 2019-07-23 NOTE — Patient Instructions (Signed)

## 2019-08-03 ENCOUNTER — Ambulatory Visit (INDEPENDENT_AMBULATORY_CARE_PROVIDER_SITE_OTHER): Payer: PRIVATE HEALTH INSURANCE | Admitting: Licensed Clinical Social Worker

## 2019-08-03 DIAGNOSIS — F324 Major depressive disorder, single episode, in partial remission: Secondary | ICD-10-CM | POA: Diagnosis not present

## 2019-08-19 IMAGING — DX PORTABLE CHEST - 1 VIEW
1 series · 1 of 1 positions shown · non-contrast
Comparison: October 10, 2017

CLINICAL DATA: Cough and shortness of breath

EXAM:
PORTABLE CHEST 1 VIEW

[chest ap]
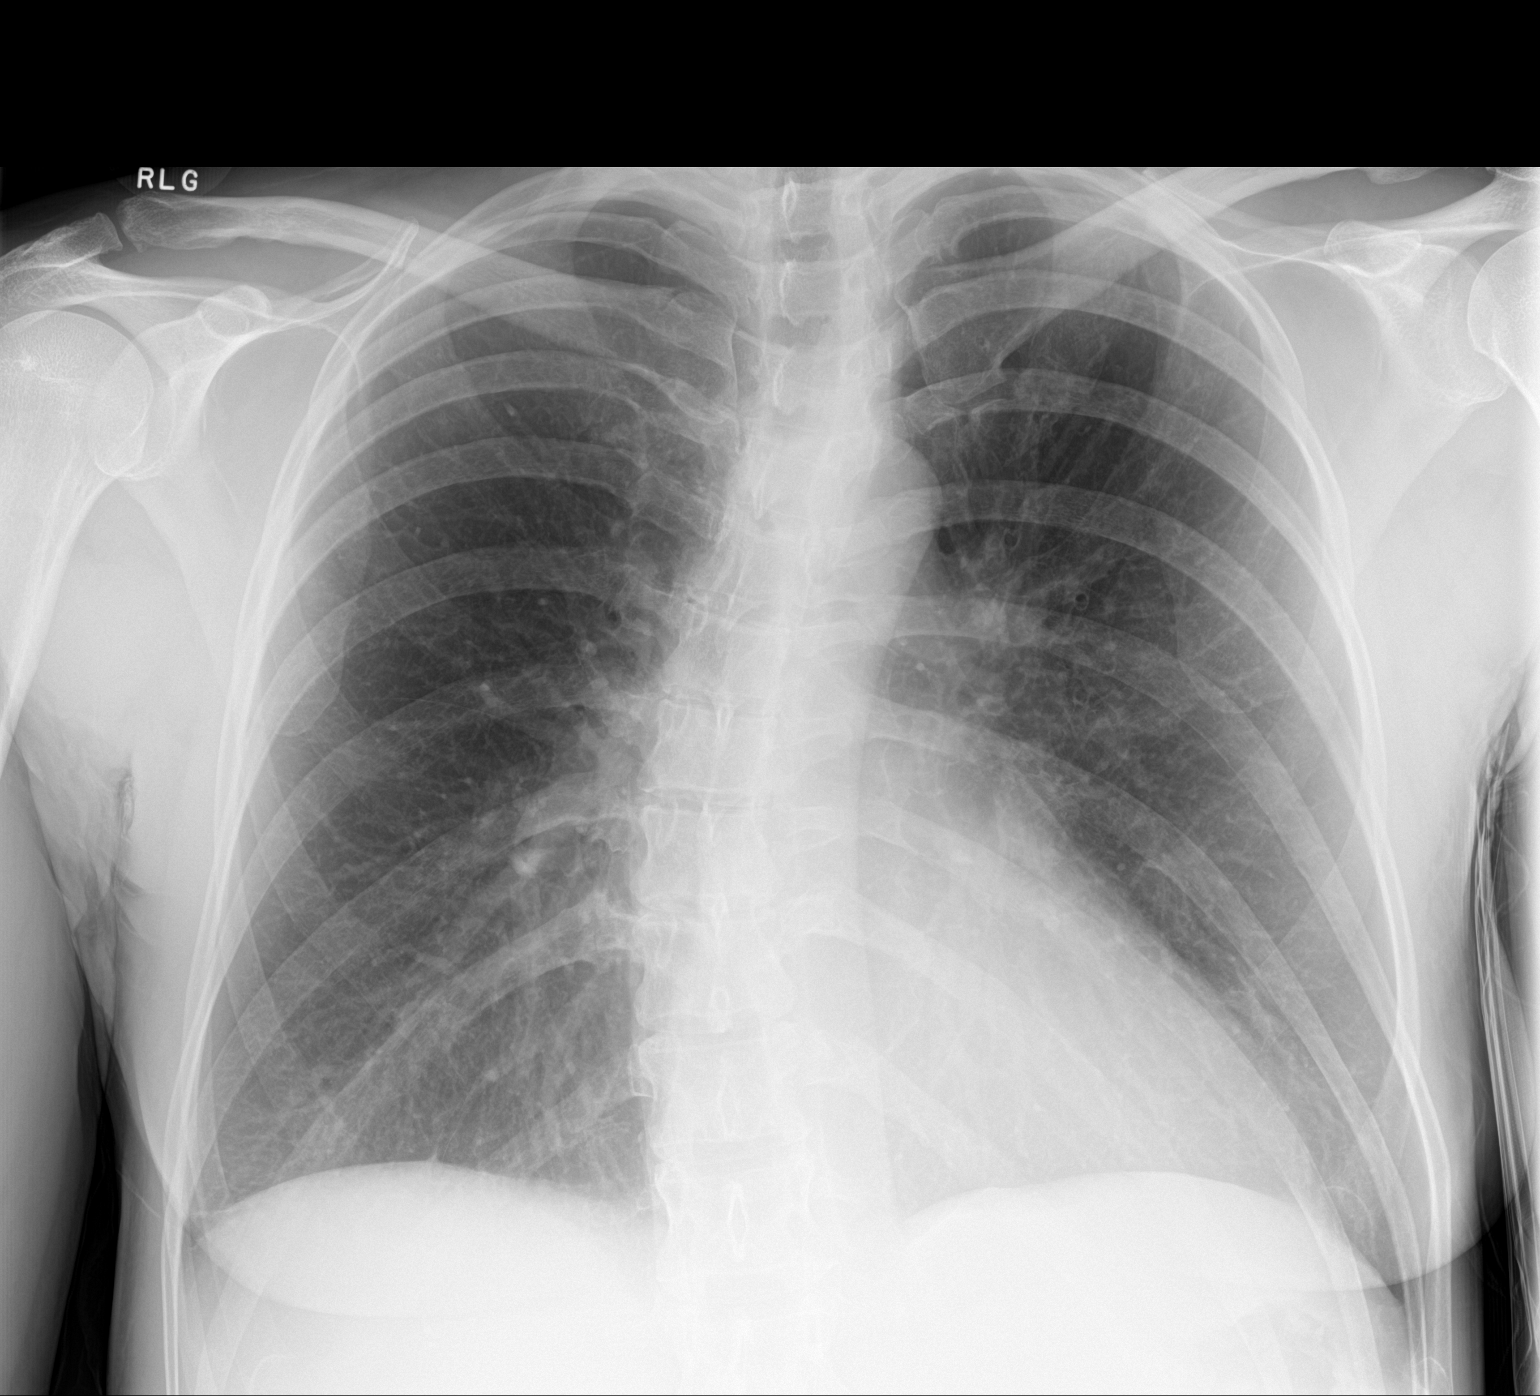

[1 of 1 positions shown; findings below may reference images not displayed]

FINDINGS: No edema or consolidation. There is minimal atelectatic change in
the lateral right base. Heart size and pulmonary vascularity are
normal. No adenopathy. There is midthoracic dextroscoliosis.
IMPRESSION: Minimal atelectasis in lateral right base. Lungs elsewhere clear.
Heart size within normal limits. Scoliosis present.

## 2019-09-01 ENCOUNTER — Ambulatory Visit (INDEPENDENT_AMBULATORY_CARE_PROVIDER_SITE_OTHER): Payer: PRIVATE HEALTH INSURANCE | Admitting: Licensed Clinical Social Worker

## 2019-09-01 DIAGNOSIS — F324 Major depressive disorder, single episode, in partial remission: Secondary | ICD-10-CM | POA: Diagnosis not present

## 2019-10-06 ENCOUNTER — Ambulatory Visit (INDEPENDENT_AMBULATORY_CARE_PROVIDER_SITE_OTHER): Payer: PRIVATE HEALTH INSURANCE | Admitting: Licensed Clinical Social Worker

## 2019-10-06 DIAGNOSIS — F324 Major depressive disorder, single episode, in partial remission: Secondary | ICD-10-CM

## 2019-10-15 ENCOUNTER — Other Ambulatory Visit: Payer: Self-pay

## 2019-10-15 ENCOUNTER — Ambulatory Visit: Payer: PRIVATE HEALTH INSURANCE

## 2019-10-15 ENCOUNTER — Ambulatory Visit
Admission: RE | Admit: 2019-10-15 | Discharge: 2019-10-15 | Disposition: A | Discharge status: Home / Self Care | Payer: PRIVATE HEALTH INSURANCE | Source: Ambulatory Visit | Attending: Gynecology | Admitting: Gynecology

## 2019-10-15 DIAGNOSIS — N6489 Other specified disorders of breast: Secondary | ICD-10-CM

## 2019-11-02 ENCOUNTER — Ambulatory Visit (INDEPENDENT_AMBULATORY_CARE_PROVIDER_SITE_OTHER): Payer: PRIVATE HEALTH INSURANCE | Admitting: Licensed Clinical Social Worker

## 2019-11-02 DIAGNOSIS — F324 Major depressive disorder, single episode, in partial remission: Secondary | ICD-10-CM

## 2019-11-03 ENCOUNTER — Ambulatory Visit: Payer: PRIVATE HEALTH INSURANCE | Admitting: Licensed Clinical Social Worker

## 2019-11-16 IMAGING — US US BREAST*L* LIMITED INC AXILLA
1 series · 5 of 5 positions shown · non-contrast
Comparison: Previous exam(s).

CLINICAL DATA: 43-year-old female for further evaluation of
possible RIGHT breast asymmetry on screening mammogram

EXAM:
DIGITAL DIAGNOSTIC RIGHT MAMMOGRAM WITH CAD AND TOMO
ULTRASOUND RIGHT BREAST

[Series 1: us breast*left* limited inc axilla · 0.05mm/px · 5 of 5 slices shown]
[im 1/5]
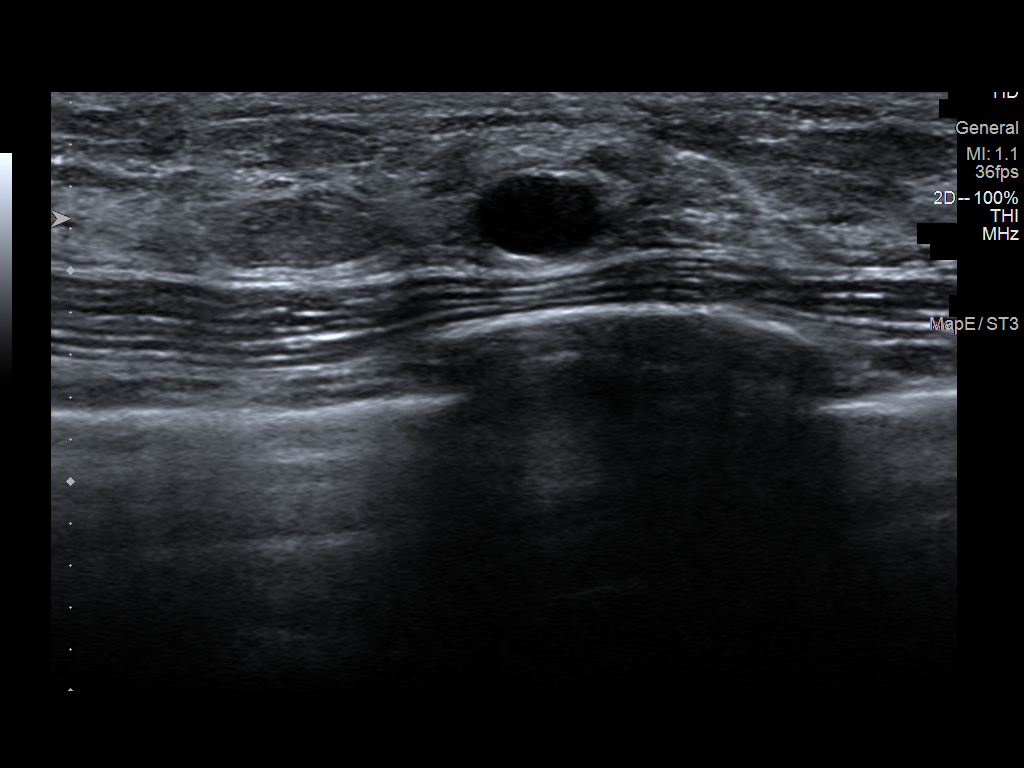
[im 2/5]
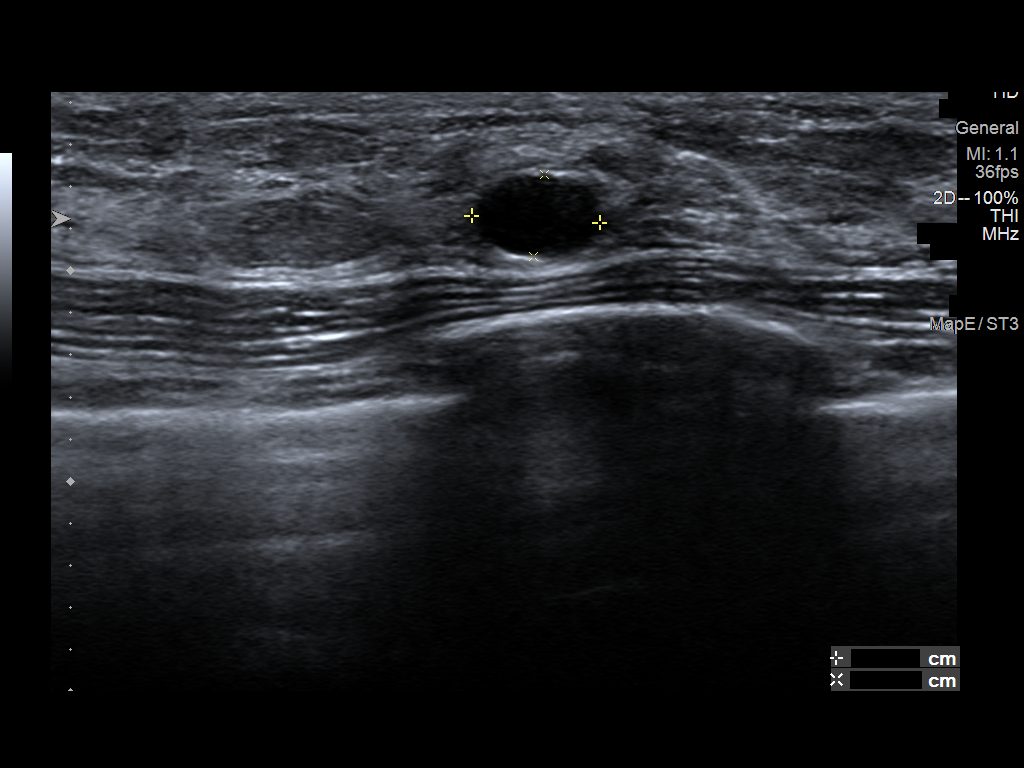
[im 3/5]
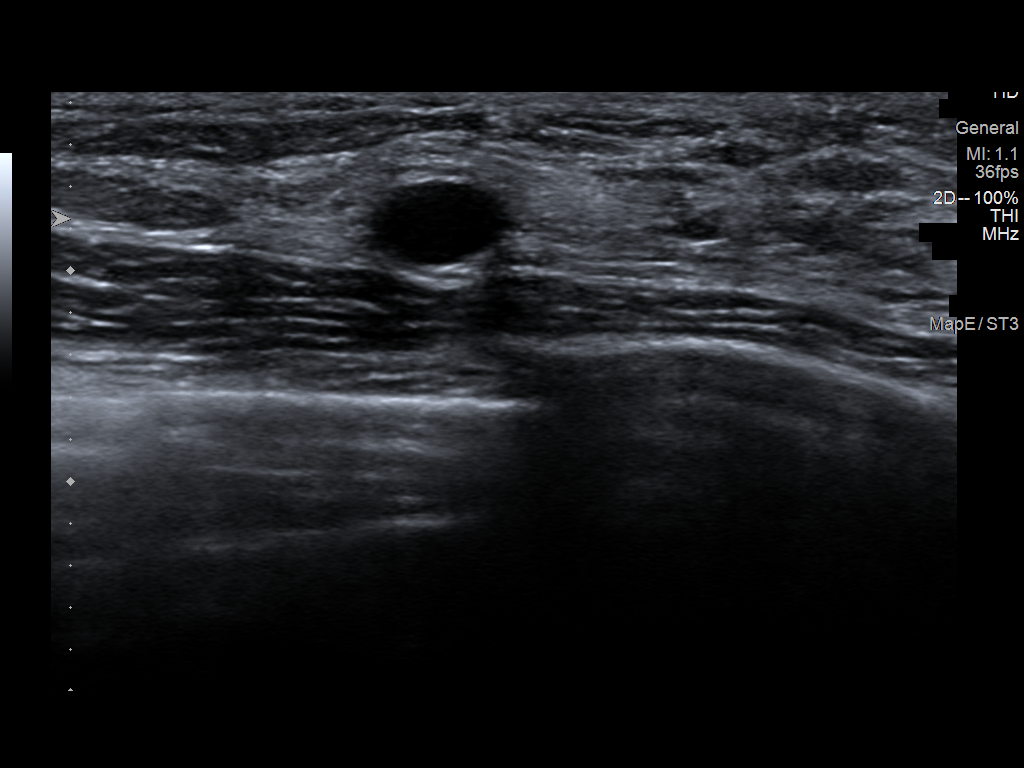
[im 4/5]
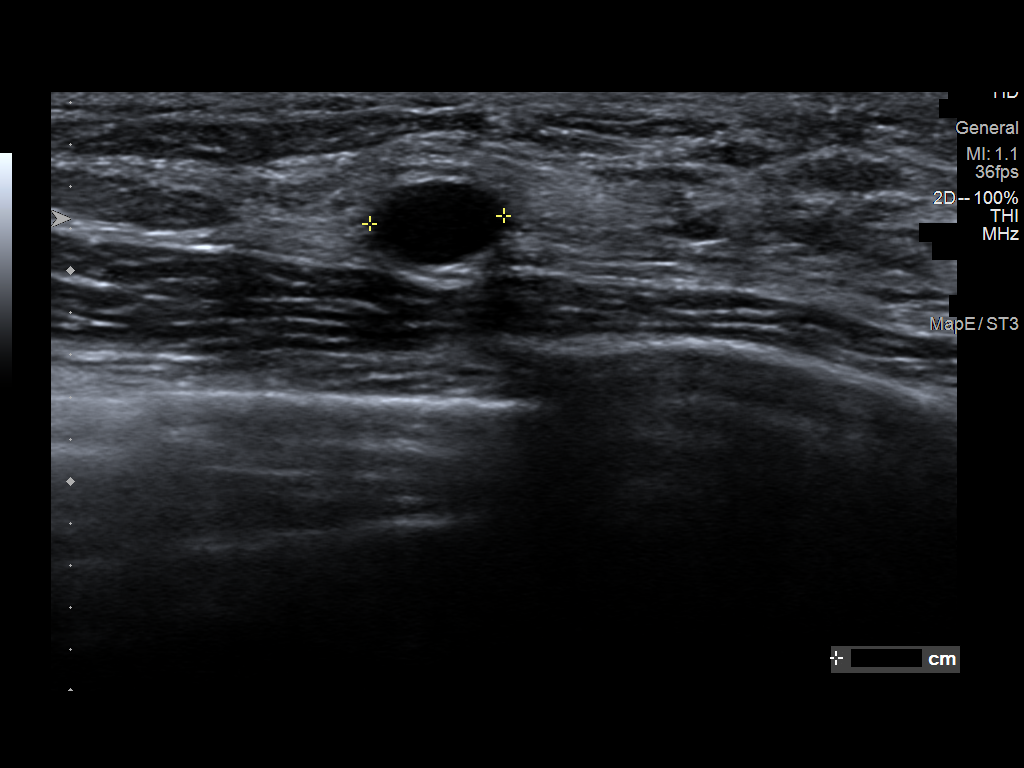
[im 5/5]
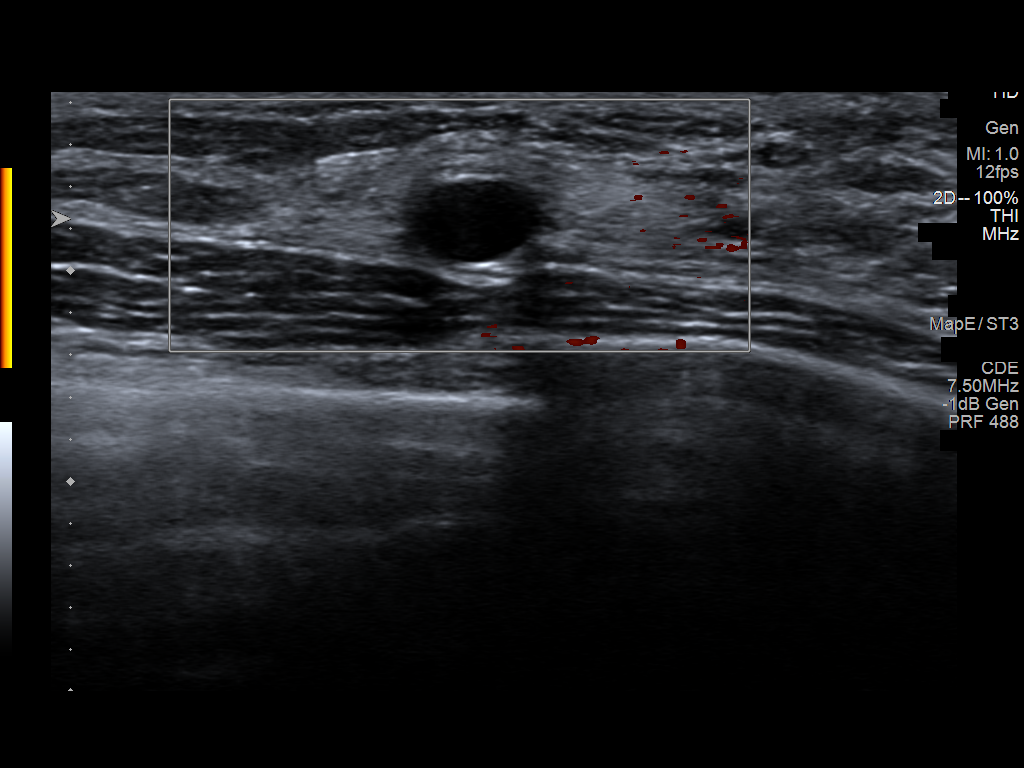

[5 of 5 positions shown; findings below may reference images not displayed]

ACR Breast Density Category d: The breast tissue is extremely dense,
which lowers the sensitivity of mammography.
FINDINGS: 2D/3D full field and spot compression views of the LEFT breast
demonstrate no definite persistent abnormality within the INNER LEFT
breast, at the site of the screening study finding.

Mammographic images were processed with CAD.

Targeted ultrasound is performed, showing no solid mass, distortion
or abnormal shadowing within the entire INNER LEFT breast.
Incidental note is made of a 6 x 4 x 6 mm benign cyst at the 9
o'clock position of the LEFT breast 3 cm from the nipple.
IMPRESSION: 1. No definite persistent mammographic abnormality or suspicious
sonographic abnormality within the INNER LEFT breast, at the site of
the screening study finding. However given breast tissue density and
the screening abnormality appearance, six-month follow-up is
recommended.

RECOMMENDATION:
LEFT diagnostic mammogram and possible LEFT breast ultrasound in 6
months.

I have discussed the findings and recommendations with the patient.
If applicable, a reminder letter will be sent to the patient
regarding the next appointment.

BI-RADS CATEGORY  3: Probably benign.

## 2019-11-30 ENCOUNTER — Ambulatory Visit: Payer: PRIVATE HEALTH INSURANCE | Admitting: Licensed Clinical Social Worker

## 2019-12-01 ENCOUNTER — Ambulatory Visit (INDEPENDENT_AMBULATORY_CARE_PROVIDER_SITE_OTHER): Payer: PRIVATE HEALTH INSURANCE | Admitting: Licensed Clinical Social Worker

## 2019-12-01 DIAGNOSIS — F324 Major depressive disorder, single episode, in partial remission: Secondary | ICD-10-CM

## 2020-02-17 ENCOUNTER — Other Ambulatory Visit: Payer: Self-pay

## 2020-02-17 MED ORDER — NORETHINDRONE ACET-ETHINYL EST 1-20 MG-MCG PO TABS: ORAL_TABLET | ORAL | 0 refills | Status: DC

## 2020-02-17 NOTE — Telephone Encounter (Signed)
CE is scheduled in May 2021 with Tiffany.

## 2020-02-24 ENCOUNTER — Encounter: Payer: PRIVATE HEALTH INSURANCE | Admitting: Nurse Practitioner

## 2020-02-25 ENCOUNTER — Other Ambulatory Visit: Payer: Self-pay | Admitting: Obstetrics and Gynecology

## 2020-02-25 DIAGNOSIS — Z1231 Encounter for screening mammogram for malignant neoplasm of breast: Secondary | ICD-10-CM

## 2020-03-01 ENCOUNTER — Ambulatory Visit (INDEPENDENT_AMBULATORY_CARE_PROVIDER_SITE_OTHER): Payer: PRIVATE HEALTH INSURANCE | Admitting: Psychology

## 2020-03-01 DIAGNOSIS — F411 Generalized anxiety disorder: Secondary | ICD-10-CM | POA: Diagnosis not present

## 2020-03-02 ENCOUNTER — Encounter: Payer: Self-pay | Admitting: Nurse Practitioner

## 2020-03-02 ENCOUNTER — Ambulatory Visit (INDEPENDENT_AMBULATORY_CARE_PROVIDER_SITE_OTHER): Payer: PRIVATE HEALTH INSURANCE | Admitting: Nurse Practitioner

## 2020-03-02 ENCOUNTER — Other Ambulatory Visit: Payer: Self-pay

## 2020-03-02 VITALS — BP 122/80 | Ht 67.0 in | Wt 164.0 lb

## 2020-03-02 DIAGNOSIS — Z13220 Encounter for screening for lipoid disorders: Secondary | ICD-10-CM | POA: Diagnosis not present

## 2020-03-02 DIAGNOSIS — N951 Menopausal and female climacteric states: Secondary | ICD-10-CM | POA: Diagnosis not present

## 2020-03-02 DIAGNOSIS — R350 Frequency of micturition: Secondary | ICD-10-CM

## 2020-03-02 DIAGNOSIS — Z01419 Encounter for gynecological examination (general) (routine) without abnormal findings: Secondary | ICD-10-CM

## 2020-03-02 LAB — NO CULTURE INDICATED

## 2020-03-02 LAB — CBC WITH DIFFERENTIAL/PLATELET
Absolute Monocytes: 421 cells/uL (ref 200–950)
Basophils Absolute: 29 cells/uL (ref 0–200)
Basophils Relative: 0.6 %
Eosinophils Absolute: 78 cells/uL (ref 15–500)
Eosinophils Relative: 1.6 %
HCT: 39.9 % (ref 35.0–45.0)
Hemoglobin: 13.5 g/dL (ref 11.7–15.5)
Lymphs Abs: 1421 cells/uL (ref 850–3900)
MCH: 32.1 pg (ref 27.0–33.0)
MCHC: 33.8 g/dL (ref 32.0–36.0)
MCV: 94.8 fL (ref 80.0–100.0)
MPV: 10 fL (ref 7.5–12.5)
Monocytes Relative: 8.6 %
Neutro Abs: 2950 cells/uL (ref 1500–7800)
Neutrophils Relative %: 60.2 %
Platelets: 348 10*3/uL (ref 140–400)
RBC: 4.21 10*6/uL (ref 3.80–5.10)
RDW: 12.1 % (ref 11.0–15.0)
Total Lymphocyte: 29 %
WBC: 4.9 10*3/uL (ref 3.8–10.8)

## 2020-03-02 LAB — URINALYSIS, COMPLETE W/RFL CULTURE
Bacteria, UA: NONE SEEN /HPF
Bilirubin Urine: NEGATIVE
Glucose, UA: NEGATIVE
Hgb urine dipstick: NEGATIVE
Hyaline Cast: NONE SEEN /LPF
Ketones, ur: NEGATIVE
Leukocyte Esterase: NEGATIVE
Nitrites, Initial: NEGATIVE
Protein, ur: NEGATIVE
RBC / HPF: NONE SEEN /HPF (ref 0–2)
Specific Gravity, Urine: 1.015 (ref 1.001–1.03)
Squamous Epithelial / HPF: NONE SEEN /HPF (ref <=5)
WBC, UA: NONE SEEN /HPF (ref 0–5)
pH: 5.5 (ref 5.0–8.0)

## 2020-03-02 LAB — COMPREHENSIVE METABOLIC PANEL
AG Ratio: 1.5 (calc) (ref 1.0–2.5)
ALT: 14 U/L (ref 6–29)
AST: 18 U/L (ref 10–30)
Albumin: 4 g/dL (ref 3.6–5.1)
Alkaline phosphatase (APISO): 33 U/L (ref 31–125)
BUN: 8 mg/dL (ref 7–25)
CO2: 25 mmol/L (ref 20–32)
Calcium: 9.5 mg/dL (ref 8.6–10.2)
Chloride: 104 mmol/L (ref 98–110)
Creat: 0.95 mg/dL (ref 0.50–1.10)
Globulin: 2.7 g/dL (calc) (ref 1.9–3.7)
Glucose, Bld: 89 mg/dL (ref 65–99)
Potassium: 5.1 mmol/L (ref 3.5–5.3)
Sodium: 137 mmol/L (ref 135–146)
Total Bilirubin: 0.4 mg/dL (ref 0.2–1.2)
Total Protein: 6.7 g/dL (ref 6.1–8.1)

## 2020-03-02 LAB — FOLLICLE STIMULATING HORMONE: FSH: 8 m[IU]/mL

## 2020-03-02 LAB — LIPID PANEL
Cholesterol: 206 mg/dL — ABNORMAL HIGH (ref <200)
HDL: 68 mg/dL (ref >=50)
LDL Cholesterol (Calc): 122 mg/dL (calc) — ABNORMAL HIGH
Non-HDL Cholesterol (Calc): 138 mg/dL (calc) — ABNORMAL HIGH (ref <130)
Total CHOL/HDL Ratio: 3 (calc) (ref <5.0)
Triglycerides: 72 mg/dL (ref <150)

## 2020-03-02 NOTE — Progress Notes (Signed)
   Cheryl Webster 08/14/1976 [id number]   History:  44 y.o. G3 P1 presents for annual exam with complaints of urinary frequency x 2 weeks, denies dysuria, hematuria, and urgency. On continuous oral contraceptives without bleeding. Started OCPs for irregular bleeding and migraines which are suppressed with continuous pills and no menses. Pap smear 11/27/2017 showed ASCUS negative high risk HPV, repeat 1 year later was normal.  Having some hot flashes and night sweats.  Has gained 15 pounds in the past year.  Active lifestyle, uses [employer] frequently.  Seeing a therapist weekly, stopped Prozac 6 months ago.  Gynecologic History No LMP recorded. (Menstrual status: Oral contraceptives).   Contraception: OCP (estrogen/progesterone) continuous Last Pap: 12/07/18. Results were: normal Last mammogram: 10/15/19 (diagnostic). Results were: normal. Scheduled for 03/30/2020  Past medical history, past surgical history, family history and social history were all reviewed and documented in the EPIC chart.  Works at [employer].  8 year old daughter.  ROS:  A ROS was performed and pertinent positives and negatives are included.  Exam:  Vitals:   03/02/20 0905  Weight: 164 lb (74.4 kg)  Height: 5\' 7"  (1.702 m)   Body mass index is 25.69 kg/m.  General appearance:  Normal Thyroid:  Symmetrical, normal in size, without palpable masses or nodularity. Respiratory  Auscultation:  Clear without wheezing or rhonchi Cardiovascular  Auscultation:  Regular rate, without rubs, murmurs or gallops  Edema/varicosities:  Not grossly evident Abdominal  Soft,nontender, without masses, guarding or rebound.  Liver/spleen:  No organomegaly noted  Hernia:  None appreciated  Skin  Inspection:  Grossly normal   Breasts: Examined lying and sitting.   Right: Without masses, retractions, discharge or axillary adenopathy.   Left: Without masses, retractions, discharge or axillary  adenopathy. Gentitourinary   Inguinal/mons:  Normal without inguinal adenopathy  External genitalia:  Normal  BUS/Urethra/Skene's glands:  Normal  Vagina:  Normal  Cervix:  Normal  Uterus:  Retroverted, normal in size, shape and contour.  Midline and mobile  Adnexa/parametria:     Rt: Without masses or tenderness.   Lt: Without masses or tenderness.  Anus and perineum: Normal  UA: Negative leukocytes, no white blood cells or red blood cells seen, no bacteria  Assessment/Plan:  44 y.o.  for annual exam.   Well female exam with routine gynecological exam - Plan: CBC with Differential/Platelet, Comprehensive metabolic panel, pap with HPV typing today. Education provided on SBEs, importance of preventative screenings, current guidelines, high calcium diet, regular exercise, and multivitamin daily.   Urinary frequency - Plan: Urinalysis,Complete w/RFL Culture. UA unremarkable. She thinks it may be related to using Peloton bike because she has had this happen in the past.  Lipid screening - Plan: Lipid panel  Menopausal symptom - Plan: FSH.  FSH in 2018 was 24.9.  Will repeat today.  Follow-up in 1 year for annual        2019 [facility], 9:05 AM 03/02/2020

## 2020-03-02 NOTE — Addendum Note (Signed)
Addended by: Tito Dine on: 03/02/2020 11:05 AM   Modules accepted: Orders

## 2020-03-02 NOTE — Patient Instructions (Signed)
Health Maintenance, Female Adopting a healthy lifestyle and getting preventive care are important in promoting health and wellness. Ask your health care provider about:  The right schedule for you to have regular tests and exams.  Things you can do on your own to prevent diseases and keep yourself healthy. What should I know about diet, weight, and exercise? Eat a healthy diet   Eat a diet that includes plenty of vegetables, fruits, low-fat dairy products, and lean protein.  Do not eat a lot of foods that are high in solid fats, added sugars, or sodium. Maintain a healthy weight Body mass index (BMI) is used to identify weight problems. It estimates body fat based on height and weight. Your health care provider can help determine your BMI and help you achieve or maintain a healthy weight. Get regular exercise Get regular exercise. This is one of the most important things you can do for your health. Most adults should:  Exercise for at least 150 minutes each week. The exercise should increase your heart rate and make you sweat (moderate-intensity exercise).  Do strengthening exercises at least twice a week. This is in addition to the moderate-intensity exercise.  Spend less time sitting. Even light physical activity can be beneficial. Watch cholesterol and blood lipids Have your blood tested for lipids and cholesterol at 44 years of age, then have this test every 5 years. Have your cholesterol levels checked more often if:  Your lipid or cholesterol levels are high.  You are older than 44 years of age.  You are at high risk for heart disease. What should I know about cancer screening? Depending on your health history and family history, you may need to have cancer screening at various ages. This may include screening for:  Breast cancer.  Cervical cancer.  Colorectal cancer.  Skin cancer.  Lung cancer. What should I know about heart disease, diabetes, and high blood  pressure? Blood pressure and heart disease  High blood pressure causes heart disease and increases the risk of stroke. This is more likely to develop in people who have high blood pressure readings, are of African descent, or are overweight.  Have your blood pressure checked: ? Every 3-5 years if you are 18-39 years of age. ? Every year if you are 40 years old or older. Diabetes Have regular diabetes screenings. This checks your fasting blood sugar level. Have the screening done:  Once every three years after age 40 if you are at a normal weight and have a low risk for diabetes.  More often and at a younger age if you are overweight or have a high risk for diabetes. What should I know about preventing infection? Hepatitis B If you have a higher risk for hepatitis B, you should be screened for this virus. Talk with your health care provider to find out if you are at risk for hepatitis B infection. Hepatitis C Testing is recommended for:  Everyone born from 1945 through 1965.  Anyone with known risk factors for hepatitis C. Sexually transmitted infections (STIs)  Get screened for STIs, including gonorrhea and chlamydia, if: ? You are sexually active and are younger than 44 years of age. ? You are older than 44 years of age and your health care provider tells you that you are at risk for this type of infection. ? Your sexual activity has changed since you were last screened, and you are at increased risk for chlamydia or gonorrhea. Ask your health care provider if   you are at risk.  Ask your health care provider about whether you are at high risk for HIV. Your health care provider may recommend a prescription medicine to help prevent HIV infection. If you choose to take medicine to prevent HIV, you should first get tested for HIV. You should then be tested every 3 months for as long as you are taking the medicine. Pregnancy  If you are about to stop having your period (premenopausal) and  you may become pregnant, seek counseling before you get pregnant.  Take 400 to 800 micrograms (mcg) of folic acid every day if you become pregnant.  Ask for birth control (contraception) if you want to prevent pregnancy. Osteoporosis and menopause Osteoporosis is a disease in which the bones lose minerals and strength with aging. This can result in bone fractures. If you are 65 years old or older, or if you are at risk for osteoporosis and fractures, ask your health care provider if you should:  Be screened for bone loss.  Take a calcium or vitamin D supplement to lower your risk of fractures.  Be given hormone replacement therapy (HRT) to treat symptoms of menopause. Follow these instructions at home: Lifestyle  Do not use any products that contain nicotine or tobacco, such as cigarettes, e-cigarettes, and chewing tobacco. If you need help quitting, ask your health care provider.  Do not use street drugs.  Do not share needles.  Ask your health care provider for help if you need support or information about quitting drugs. Alcohol use  Do not drink alcohol if: ? Your health care provider tells you not to drink. ? You are pregnant, may be pregnant, or are planning to become pregnant.  If you drink alcohol: ? Limit how much you use to 0-1 drink a day. ? Limit intake if you are breastfeeding.  Be aware of how much alcohol is in your drink. In the [place] one drink equals one 12 oz bottle of beer (355 mL), one 5 oz glass of wine (148 mL), or one 1 oz glass of hard liquor (44 mL). General instructions  Schedule regular health, dental, and eye exams.  Stay current with your vaccines.  Tell your health care provider if: ? You often feel depressed. ? You have ever been abused or do not feel safe at home. Summary  Adopting a healthy lifestyle and getting preventive care are important in promoting health and wellness.  Follow your health care provider's instructions about healthy  diet, exercising, and getting tested or screened for diseases.  Follow your health care provider's instructions on monitoring your cholesterol and blood pressure. This information is not intended to replace advice given to you by your health care provider. Make sure you discuss any questions you have with your health care provider. Document Revised: 09/09/2018 Document Reviewed: 09/09/2018 Elsevier Patient Education  2020 [employer]  

## 2020-03-07 LAB — PAP IG W/ RFLX HPV ASCU

## 2020-03-30 ENCOUNTER — Ambulatory Visit
Admission: RE | Admit: 2020-03-30 | Discharge: 2020-03-30 | Disposition: A | Discharge status: Home / Self Care | Payer: PRIVATE HEALTH INSURANCE | Source: Ambulatory Visit | Attending: Obstetrics and Gynecology | Admitting: Obstetrics and Gynecology

## 2020-03-30 ENCOUNTER — Other Ambulatory Visit: Payer: Self-pay

## 2020-03-30 DIAGNOSIS — Z1231 Encounter for screening mammogram for malignant neoplasm of breast: Secondary | ICD-10-CM

## 2020-04-05 ENCOUNTER — Ambulatory Visit (INDEPENDENT_AMBULATORY_CARE_PROVIDER_SITE_OTHER): Payer: No Typology Code available for payment source | Admitting: Psychology

## 2020-04-05 DIAGNOSIS — F411 Generalized anxiety disorder: Secondary | ICD-10-CM | POA: Diagnosis not present

## 2020-04-06 ENCOUNTER — Other Ambulatory Visit: Payer: Self-pay | Admitting: Nurse Practitioner

## 2020-05-03 ENCOUNTER — Ambulatory Visit (INDEPENDENT_AMBULATORY_CARE_PROVIDER_SITE_OTHER): Payer: No Typology Code available for payment source | Admitting: Psychology

## 2020-05-03 DIAGNOSIS — F411 Generalized anxiety disorder: Secondary | ICD-10-CM

## 2020-06-06 ENCOUNTER — Ambulatory Visit (INDEPENDENT_AMBULATORY_CARE_PROVIDER_SITE_OTHER): Payer: No Typology Code available for payment source | Admitting: Psychology

## 2020-06-06 DIAGNOSIS — F411 Generalized anxiety disorder: Secondary | ICD-10-CM

## 2020-07-04 ENCOUNTER — Ambulatory Visit (INDEPENDENT_AMBULATORY_CARE_PROVIDER_SITE_OTHER): Payer: No Typology Code available for payment source | Admitting: Psychology

## 2020-07-04 DIAGNOSIS — F411 Generalized anxiety disorder: Secondary | ICD-10-CM | POA: Diagnosis not present

## 2020-08-03 ENCOUNTER — Ambulatory Visit (INDEPENDENT_AMBULATORY_CARE_PROVIDER_SITE_OTHER): Payer: No Typology Code available for payment source | Admitting: Psychology

## 2020-08-03 DIAGNOSIS — F411 Generalized anxiety disorder: Secondary | ICD-10-CM

## 2020-08-31 ENCOUNTER — Ambulatory Visit: Payer: No Typology Code available for payment source | Admitting: Psychology

## 2020-10-05 ENCOUNTER — Ambulatory Visit (INDEPENDENT_AMBULATORY_CARE_PROVIDER_SITE_OTHER): Payer: No Typology Code available for payment source | Admitting: Psychology

## 2020-10-05 DIAGNOSIS — F411 Generalized anxiety disorder: Secondary | ICD-10-CM

## 2020-10-12 ENCOUNTER — Telehealth: Payer: Self-pay | Admitting: *Deleted

## 2020-10-12 MED ORDER — NORETHINDRONE ACET-ETHINYL EST 1-20 MG-MCG PO TABS: ORAL_TABLET | ORAL | 1 refills | Status: DC

## 2020-10-12 NOTE — Telephone Encounter (Signed)
Patient called requesting birth control pills sent to local pharmacy, reports the mail order [facility] does not have Rx currently out of stock.

## 2020-11-01 ENCOUNTER — Ambulatory Visit: Payer: No Typology Code available for payment source | Admitting: Psychology

## 2020-11-29 ENCOUNTER — Ambulatory Visit (INDEPENDENT_AMBULATORY_CARE_PROVIDER_SITE_OTHER): Payer: No Typology Code available for payment source | Admitting: Psychology

## 2020-11-29 DIAGNOSIS — F411 Generalized anxiety disorder: Secondary | ICD-10-CM | POA: Diagnosis not present

## 2020-12-27 ENCOUNTER — Ambulatory Visit (INDEPENDENT_AMBULATORY_CARE_PROVIDER_SITE_OTHER): Payer: No Typology Code available for payment source | Admitting: Psychology

## 2020-12-27 DIAGNOSIS — F411 Generalized anxiety disorder: Secondary | ICD-10-CM | POA: Diagnosis not present

## 2021-01-02 ENCOUNTER — Telehealth (INDEPENDENT_AMBULATORY_CARE_PROVIDER_SITE_OTHER): Payer: PRIVATE HEALTH INSURANCE | Admitting: Family Medicine

## 2021-01-02 DIAGNOSIS — U071 COVID-19: Secondary | ICD-10-CM

## 2021-01-02 MED ORDER — ALBUTEROL SULFATE HFA 108 (90 BASE) MCG/ACT IN AERS: 2.0000 | INHALATION_SPRAY | Freq: Four times a day (QID) | RESPIRATORY_TRACT | 0 refills | Status: DC | PRN

## 2021-01-02 NOTE — Progress Notes (Signed)
Virtual Visit via Video Note  I connected with Cheryl Webster  on 01/02/21 at 10:00 AM EDT by a video enabled telemedicine application and verified that I am speaking with the correct person using two identifiers.  Location patient: home,  Location provider:work or home office Persons participating in the virtual visit: patient, provider  I discussed the limitations of evaluation and management by telemedicine and the availability of in person appointments. The patient expressed understanding and agreed to proceed.   HPI:  Acute telemedicine visit for : -Onset: 12/26/20; had positive covid test today -Symptoms include: scratchy throat, nasal congestion, hoarseness, pnd, some sinus pressure, mild headache, mild cough, sneezing, lost sense of smell/taste yesterday -feels a little better today but is concerned about long standing symptoms with covid -Denies: CP, SOB (but feels like notices breathing with activity with full breath), worst ha, fevers, NVD, inability to get out of bed/eat/drink -Has tried: allergy pill and nettie pot, nyquil -Pertinent past medical history: see below; has used albuterol in the past when sick -Pertinent medication allergies: bee venom, sulfa -COVID-19 vaccine status: had covid 2 years ago; 2 doses + booster  ROS: See pertinent positives and negatives per HPI.  Past Medical History:  Diagnosis Date  . Anxiety   . ASCUS of cervix with negative high risk HPV 11/2017  . Breast cyst right  . Frequent headaches     Past Surgical History:  Procedure Laterality Date  . IUD REMOVAL    . TEMPOROMANDIBULAR JOINT SURGERY  AGE13  . THERAPEUTIC ABORTION  AGE 89, 21  . TONSILLECTOMY AND ADENOIDECTOMY  AGE 73  . TYMPANOSTOMY TUBE PLACEMENT  AGE 30s     Current Outpatient Medications:  .  Azelastine-Fluticasone 137-50 MCG/ACT SUSP, Place 1 spray into the nose 2 (two) times daily., Disp: 23 g, Rfl: 1 .  Cholecalciferol (VITAMIN D PO), Take by mouth., Disp: , Rfl:  .   Cranberry Extract 200 MG CAPS, Take by mouth., Disp: , Rfl:  .  Lactobacillus (ACIDOPHILUS PO), Take by mouth.  , Disp: , Rfl:  .  Multiple Vitamins-Iron (MULTIVITAMIN/IRON PO), Take by mouth.  , Disp: , Rfl:  .  norethindrone-ethinyl estradiol (LOESTRIN) 1-20 MG-MCG tablet, TAKE 1 ACTIVE TABLET BY  MOUTH DAILY CONTINUOUSLY AS DIRECTED, Disp: 84 tablet, Rfl: 1 .  vitamin B-12 (CYANOCOBALAMIN) 1000 MCG tablet, Take 1,000 mcg by mouth daily., Disp: , Rfl:   EXAM:  VITALS per patient if applicable:  GENERAL: alert, oriented, appears well and in no acute distress  HEENT: atraumatic, conjunttiva clear, no obvious abnormalities on inspection of external nose and ears  NECK: normal movements of the head and neck  LUNGS: on inspection no signs of respiratory distress, breathing rate appears normal, no obvious gross SOB, gasping or wheezing  CV: no obvious cyanosis  MS: moves all visible extremities without noticeable abnormality  PSYCH/NEURO: pleasant and cooperative, no obvious depression or anxiety, speech and thought processing grossly intact  ASSESSMENT AND PLAN:  Discussed the following assessment and plan:  COVID-19  -we discussed possible serious and likely etiologies, options for evaluation and workup, limitations of telemedicine visit vs in person visit, treatment, treatment risks and precautions. Pt prefers to treat via telemedicine empirically rather than in person at this moment. She reports she is improving today. Opted for symptomatic care per patient instructions. Albuterol inhaler in case needed. Potential complications, precautions, isolation discussed. Also discussed treatment for covid, but does not have any high risks conditions and is out of treatment window for likely  benefit. Work/School slipped offered: provided in patient instructions Scheduled follow up with PCP offered:agrees to schedule follow up with PCP if need Advised to seek prompt in person care if worsening,  new symptoms arise, or if is not improving with treatment. Discussed options for inperson care if PCP office not available. Did let this patient know that I only do telemedicine on Tuesdays and Thursdays for [place]. Advised to schedule follow up visit with PCP or [facility] if any further questions or concerns to avoid delays in care.   I discussed the assessment and treatment plan with the patient. The patient was provided an opportunity to ask questions and all were answered. The patient agreed with the plan and demonstrated an understanding of the instructions.     Terressa Koyanagi, DO

## 2021-01-02 NOTE — Patient Instructions (Addendum)
   ---------------------------------------------------------------------------------------------------------------------------      WORK SLIP:  Patient Cheryl Webster,  1976-03-17, was seen for a medical visit today, 01/02/21 . Please excuse from work for a COVID like illness. We advise 10 days minimum from the onset of symptoms (12/26/20) PLUS 1 day of no fever and improved symptoms.   Sincerely: E-signature: Dr. Kriste Basque, DO [facility] Ph: [phone]   ------------------------------------------------------------------------------------------------------------------------------    HOME CARE TIPS:  -I sent the medication(s) we discussed to your pharmacy: Meds ordered this encounter  Medications  . albuterol (PROAIR HFA) 108 (90 Base) MCG/ACT inhaler    Sig: Inhale 2 puffs into the lungs every 6 (six) hours as needed for wheezing or shortness of breath.    Dispense:  1 each    Refill:  0     -can use tylenol or aleve if needed for fevers, aches and pains per instructions  -can use nasal saline a few times per day if you have nasal congestion  -stay hydrated, drink plenty of fluids and eat small healthy meals - avoid dairy  -can take 1000 IU ( ) Vit D3 and 100-500 mg of Vit C daily per instructions  -If the Covid test is positive, check out the CDC website for more information on home care, transmission and treatment for COVID19  -follow up with your doctor in 2-3 days unless improving and feeling better  -stay home while sick, except to seek medical care, and if you have COVID19 ideally it would be best to stay home for a full 10 days since the onset of symptoms PLUS one day of no fever and feeling better. Wear a good mask (such as N95 or KN95) if around others to reduce the risk of transmission.  It was nice to meet you today, and I really hope you are feeling better soon. I help [place] out with telemedicine visits on Tuesdays and  Thursdays and am available for visits on those days. If you have any concerns or questions following this visit please schedule a follow up visit with your Primary Care doctor or seek care at a local urgent care clinic to avoid delays in care.    Seek in person care or schedule a follow up video visit promptly if your symptoms worsen, new concerns arise or you are not improving with treatment. Call 911 and/or seek emergency care if your symptoms are severe or life threatening.

## 2021-01-24 ENCOUNTER — Ambulatory Visit: Payer: No Typology Code available for payment source | Admitting: Psychology

## 2021-01-29 ENCOUNTER — Other Ambulatory Visit: Payer: Self-pay | Admitting: Nurse Practitioner

## 2021-01-30 ENCOUNTER — Ambulatory Visit (INDEPENDENT_AMBULATORY_CARE_PROVIDER_SITE_OTHER): Payer: No Typology Code available for payment source | Admitting: Psychology

## 2021-01-30 DIAGNOSIS — F411 Generalized anxiety disorder: Secondary | ICD-10-CM

## 2021-01-30 NOTE — Telephone Encounter (Signed)
Medication refill request: loestrin 1-20 Last AEX:  03-02-2020 Next AEX: 03-05-2021 Last MMG (if hormonal medication request): 04-02-2020 category d density birads 1:neg Refill authorized: please approve if appropriate

## 2021-02-21 ENCOUNTER — Ambulatory Visit: Payer: No Typology Code available for payment source | Admitting: Psychology

## 2021-02-27 ENCOUNTER — Other Ambulatory Visit: Payer: Self-pay | Admitting: Obstetrics and Gynecology

## 2021-02-27 DIAGNOSIS — Z1231 Encounter for screening mammogram for malignant neoplasm of breast: Secondary | ICD-10-CM

## 2021-03-05 ENCOUNTER — Ambulatory Visit (INDEPENDENT_AMBULATORY_CARE_PROVIDER_SITE_OTHER): Payer: PRIVATE HEALTH INSURANCE | Admitting: Nurse Practitioner

## 2021-03-05 ENCOUNTER — Encounter: Payer: Self-pay | Admitting: Nurse Practitioner

## 2021-03-05 ENCOUNTER — Other Ambulatory Visit: Payer: Self-pay

## 2021-03-05 ENCOUNTER — Telehealth: Payer: Self-pay | Admitting: *Deleted

## 2021-03-05 VITALS — BP 112/64 | Ht 66.0 in | Wt 143.0 lb

## 2021-03-05 DIAGNOSIS — R350 Frequency of micturition: Secondary | ICD-10-CM

## 2021-03-05 DIAGNOSIS — Z1211 Encounter for screening for malignant neoplasm of colon: Secondary | ICD-10-CM

## 2021-03-05 DIAGNOSIS — E785 Hyperlipidemia, unspecified: Secondary | ICD-10-CM | POA: Diagnosis not present

## 2021-03-05 DIAGNOSIS — Z01419 Encounter for gynecological examination (general) (routine) without abnormal findings: Secondary | ICD-10-CM

## 2021-03-05 LAB — URINALYSIS, COMPLETE W/RFL CULTURE
Bacteria, UA: NONE SEEN /HPF
Bilirubin Urine: NEGATIVE
Glucose, UA: NEGATIVE
Hgb urine dipstick: NEGATIVE
Hyaline Cast: NONE SEEN /LPF
Ketones, ur: NEGATIVE
Leukocyte Esterase: NEGATIVE
Nitrites, Initial: NEGATIVE
Protein, ur: NEGATIVE
RBC / HPF: NONE SEEN /HPF (ref 0–2)
Specific Gravity, Urine: 1.025 (ref 1.001–1.035)
WBC, UA: NONE SEEN /HPF (ref 0–5)
pH: 5.5 (ref 5.0–8.0)

## 2021-03-05 LAB — NO CULTURE INDICATED

## 2021-03-05 NOTE — Telephone Encounter (Signed)
Patient left voicemail regarding referral for screening colonoscopy to [facility].   Message left to return call to Jena at [phone].

## 2021-03-05 NOTE — Telephone Encounter (Signed)
Patient called stating [facility] refused to schedule her without a referral placed in epic. Referral placed. Patient will call to schedule.

## 2021-03-05 NOTE — Progress Notes (Signed)
   Akila Batta Jan 07, 1976 [id number]   History:  45 y.o. [id number] presents for annual exam. Complains of urinary frequency that is not new for her. She has noticed she urinates 1-2 times per night, she does consume large amounts of water during the day. Amenorrheic/OCPs continuously. Complained of mild menopausal symptoms last year, FSH 8.0 at that time. Normal pap and mammogram history.   Gynecologic History No LMP recorded (lmp unknown). (Menstrual status: Oral contraceptives).   Contraception/Family planning: OCP (estrogen/progesterone)  Health Maintenance Last Pap: 03/02/2020. Results were: normal Last mammogram: 03/30/2020. Results were: normal Last colonoscopy: Not indicated Last Dexa: Not indicated  Past medical history, past surgical history, family history and social history were all reviewed and documented in the EPIC chart. 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 7 yo daughter - going to [employer] in the fall.   ROS:  A ROS was performed and pertinent positives and negatives are included.  Exam:  Vitals:   03/05/21 1017  BP: 112/64  Weight: 143 lb (64.9 kg)  Height: 5\' 6"  (1.676 m)   Body mass index is 23.08 kg/m.  General appearance:  Normal Thyroid:  Symmetrical, normal in size, without palpable masses or nodularity. Respiratory  Auscultation:  Clear without wheezing or rhonchi Cardiovascular  Auscultation:  Regular rate, without rubs, murmurs or gallops  Edema/varicosities:  Not grossly evident Abdominal  Soft,nontender, without masses, guarding or rebound.  Liver/spleen:  No organomegaly noted  Hernia:  None appreciated  Skin  Inspection:  Grossly normal Breasts: Examined lying and sitting.   Right: Without masses, retractions, nipple discharge or axillary adenopathy.   Left: Without masses, retractions, nipple discharge or axillary adenopathy. Genitourinary   Inguinal/mons:  Normal without inguinal adenopathy  External genitalia:  Normal appearing  vulva with no masses, tenderness, or lesions  BUS/Urethra/Skene's glands:  Normal  Vagina:  Normal appearing with normal color and discharge, no lesions  Cervix:  Normal appearing without discharge or lesions  Uterus:  Normal in size, shape and contour.  Midline and mobile, nontender  Adnexa/parametria:     Rt: Normal in size, without masses or tenderness.   Lt: Normal in size, without masses or tenderness.  Anus and perineum: Normal  Digital rectal exam: Normal sphincter tone without palpated masses or tenderness  UA negative  Assessment/Plan:  45 y.o. 54 for annual exam.   Well female exam with routine gynecological exam - Plan: CBC with Differential/Platelet, Comprehensive metabolic panel. Education provided on SBEs, importance of preventative screenings, current guidelines, high calcium diet, regular exercise, and multivitamin daily.  Hyperlipidemia, unspecified hyperlipidemia type - Plan: Lipid panel  Frequent urination - Plan: Urinalysis,Complete w/RFL Culture. Negative UA. She does consume a lot of water and reports having to get up during 1-2 times per night. Recommend avoiding fluids 2 hours before bedtime.   Screening for cervical cancer - Normal Pap history.  Will repeat at 5-year interval per guidelines.  Screening for breast cancer - Normal mammogram history.  Continue annual screenings.  Normal breast exam today.  Screening for colon cancer - Has not had screening colonoscopy. MGM with history of colon cancer. Discussed current guidelines and importance of preventative screenings. Information provided on [facility] and recommendations to schedule colonoscopy.  Return in 1 year for annual.    [id number] DNP, 10:48 AM 03/05/2021

## 2021-03-05 NOTE — Patient Instructions (Addendum)
Schedule colonoscopy! [facility] [phone] [address] [place], [place] [address]   Health Maintenance, Female Adopting a healthy lifestyle and getting preventive care are important in promoting health and wellness. Ask your health care provider about:  The right schedule for you to have regular tests and exams.  Things you can do on your own to prevent diseases and keep yourself healthy. What should I know about diet, weight, and exercise? Eat a healthy diet  Eat a diet that includes plenty of vegetables, fruits, low-fat dairy products, and lean protein.  Do not eat a lot of foods that are high in solid fats, added sugars, or sodium.   Maintain a healthy weight Body mass index (BMI) is used to identify weight problems. It estimates body fat based on height and weight. Your health care provider can help determine your BMI and help you achieve or maintain a healthy weight. Get regular exercise Get regular exercise. This is one of the most important things you can do for your health. Most adults should:  Exercise for at least 150 minutes each week. The exercise should increase your heart rate and make you sweat (moderate-intensity exercise).  Do strengthening exercises at least twice a week. This is in addition to the moderate-intensity exercise.  Spend less time sitting. Even light physical activity can be beneficial. Watch cholesterol and blood lipids Have your blood tested for lipids and cholesterol at 45 years of age, then have this test every 5 years. Have your cholesterol levels checked more often if:  Your lipid or cholesterol levels are high.  You are older than 45 years of age.  You are at high risk for heart disease. What should I know about cancer screening? Depending on your health history and family history, you may need to have cancer screening at various ages. This may include screening for:  Breast cancer.  Cervical cancer.  Colorectal cancer.  Skin  cancer.  Lung cancer. What should I know about heart disease, diabetes, and high blood pressure? Blood pressure and heart disease  High blood pressure causes heart disease and increases the risk of stroke. This is more likely to develop in people who have high blood pressure readings, are of African descent, or are overweight.  Have your blood pressure checked: ? Every 3-5 years if you are 18-39 years of age. ? Every year if you are 40 years old or older. Diabetes Have regular diabetes screenings. This checks your fasting blood sugar level. Have the screening done:  Once every three years after age 40 if you are at a normal weight and have a low risk for diabetes.  More often and at a younger age if you are overweight or have a high risk for diabetes. What should I know about preventing infection? Hepatitis B If you have a higher risk for hepatitis B, you should be screened for this virus. Talk with your health care provider to find out if you are at risk for hepatitis B infection. Hepatitis C Testing is recommended for:  Everyone born from 1945 through 1965.  Anyone with known risk factors for hepatitis C. Sexually transmitted infections (STIs)  Get screened for STIs, including gonorrhea and chlamydia, if: ? You are sexually active and are younger than 45 years of age. ? You are older than 45 years of age and your health care provider tells you that you are at risk for this type of infection. ? Your sexual activity has changed since you were last screened,   and you are at increased risk for chlamydia or gonorrhea. Ask your health care provider if you are at risk.  Ask your health care provider about whether you are at high risk for HIV. Your health care provider may recommend a prescription medicine to help prevent HIV infection. If you choose to take medicine to prevent HIV, you should first get tested for HIV. You should then be tested every 3 months for as long as you are taking  the medicine. Pregnancy  If you are about to stop having your period (premenopausal) and you may become pregnant, seek counseling before you get pregnant.  Take 400 to 800 micrograms (mcg) of folic acid every day if you become pregnant.  Ask for birth control (contraception) if you want to prevent pregnancy. Osteoporosis and menopause Osteoporosis is a disease in which the bones lose minerals and strength with aging. This can result in bone fractures. If you are 65 years old or older, or if you are at risk for osteoporosis and fractures, ask your health care provider if you should:  Be screened for bone loss.  Take a calcium or vitamin D supplement to lower your risk of fractures.  Be given hormone replacement therapy (HRT) to treat symptoms of menopause. Follow these instructions at home: Lifestyle  Do not use any products that contain nicotine or tobacco, such as cigarettes, e-cigarettes, and chewing tobacco. If you need help quitting, ask your health care provider.  Do not use street drugs.  Do not share needles.  Ask your health care provider for help if you need support or information about quitting drugs. Alcohol use  Do not drink alcohol if: ? Your health care provider tells you not to drink. ? You are pregnant, may be pregnant, or are planning to become pregnant.  If you drink alcohol: ? Limit how much you use to 0-1 drink a day. ? Limit intake if you are breastfeeding.  Be aware of how much alcohol is in your drink. In the [place] one drink equals one 12 oz bottle of beer (355 mL), one 5 oz glass of wine (148 mL), or one 1 oz glass of hard liquor (44 mL). General instructions  Schedule regular health, dental, and eye exams.  Stay current with your vaccines.  Tell your health care provider if: ? You often feel depressed. ? You have ever been abused or do not feel safe at home. Summary  Adopting a healthy lifestyle and getting preventive care are important in  promoting health and wellness.  Follow your health care provider's instructions about healthy diet, exercising, and getting tested or screened for diseases.  Follow your health care provider's instructions on monitoring your cholesterol and blood pressure. This information is not intended to replace advice given to you by your health care provider. Make sure you discuss any questions you have with your health care provider. Document Revised: 09/09/2018 Document Reviewed: 09/09/2018 Elsevier Patient Education  2021 Elsevier Inc.  

## 2021-03-06 ENCOUNTER — Encounter: Payer: Self-pay | Admitting: Gastroenterology

## 2021-03-06 LAB — CBC WITH DIFFERENTIAL/PLATELET
Absolute Monocytes: 328 cells/uL (ref 200–950)
Basophils Absolute: 29 cells/uL (ref 0–200)
Basophils Relative: 0.6 %
Eosinophils Absolute: 132 cells/uL (ref 15–500)
Eosinophils Relative: 2.7 %
HCT: 38.7 % (ref 35.0–45.0)
Hemoglobin: 12.7 g/dL (ref 11.7–15.5)
Lymphs Abs: 1539 cells/uL (ref 850–3900)
MCH: 31.4 pg (ref 27.0–33.0)
MCHC: 32.8 g/dL (ref 32.0–36.0)
MCV: 95.6 fL (ref 80.0–100.0)
MPV: 10.7 fL (ref 7.5–12.5)
Monocytes Relative: 6.7 %
Neutro Abs: 2871 cells/uL (ref 1500–7800)
Neutrophils Relative %: 58.6 %
Platelets: 293 10*3/uL (ref 140–400)
RBC: 4.05 10*6/uL (ref 3.80–5.10)
RDW: 12.1 % (ref 11.0–15.0)
Total Lymphocyte: 31.4 %
WBC: 4.9 10*3/uL (ref 3.8–10.8)

## 2021-03-06 LAB — COMPREHENSIVE METABOLIC PANEL
AG Ratio: 1.6 (calc) (ref 1.0–2.5)
ALT: 12 U/L (ref 6–29)
AST: 15 U/L (ref 10–35)
Albumin: 4.1 g/dL (ref 3.6–5.1)
Alkaline phosphatase (APISO): 38 U/L (ref 31–125)
BUN: 7 mg/dL (ref 7–25)
CO2: 26 mmol/L (ref 20–32)
Calcium: 9.4 mg/dL (ref 8.6–10.2)
Chloride: 105 mmol/L (ref 98–110)
Creat: 0.91 mg/dL (ref 0.50–1.10)
Globulin: 2.5 g/dL (calc) (ref 1.9–3.7)
Glucose, Bld: 86 mg/dL (ref 65–99)
Potassium: 4.5 mmol/L (ref 3.5–5.3)
Sodium: 140 mmol/L (ref 135–146)
Total Bilirubin: 0.3 mg/dL (ref 0.2–1.2)
Total Protein: 6.6 g/dL (ref 6.1–8.1)

## 2021-03-06 LAB — LIPID PANEL
Cholesterol: 164 mg/dL (ref <200)
HDL: 68 mg/dL (ref >=50)
LDL Cholesterol (Calc): 80 mg/dL (calc)
Non-HDL Cholesterol (Calc): 96 mg/dL (calc) (ref <130)
Total CHOL/HDL Ratio: 2.4 (calc) (ref <5.0)
Triglycerides: 76 mg/dL (ref <150)

## 2021-03-29 ENCOUNTER — Ambulatory Visit: Payer: No Typology Code available for payment source | Admitting: Psychology

## 2021-04-17 ENCOUNTER — Other Ambulatory Visit: Payer: Self-pay

## 2021-04-17 ENCOUNTER — Ambulatory Visit
Admission: RE | Admit: 2021-04-17 | Discharge: 2021-04-17 | Disposition: A | Discharge status: Home / Self Care | Payer: No Typology Code available for payment source | Source: Ambulatory Visit | Attending: Obstetrics and Gynecology | Admitting: Obstetrics and Gynecology

## 2021-04-17 DIAGNOSIS — Z1231 Encounter for screening mammogram for malignant neoplasm of breast: Secondary | ICD-10-CM

## 2021-04-19 ENCOUNTER — Other Ambulatory Visit: Payer: Self-pay | Admitting: Nurse Practitioner

## 2021-04-19 NOTE — Telephone Encounter (Signed)
Annual exam was on 03/05/21

## 2021-04-20 ENCOUNTER — Ambulatory Visit (AMBULATORY_SURGERY_CENTER): Payer: No Typology Code available for payment source

## 2021-04-20 VITALS — Ht 66.0 in | Wt 131.0 lb

## 2021-04-20 DIAGNOSIS — Z1211 Encounter for screening for malignant neoplasm of colon: Secondary | ICD-10-CM

## 2021-04-20 NOTE — Progress Notes (Signed)
No egg or soy allergy known to patient  No issues with past sedation with any surgeries or procedures Patient denies ever being told they had issues or difficulty with intubation  No FH of Malignant Hyperthermia No diet pills per patient No home 02 use per patient  No blood thinners per patient  Pt denies issues with constipation  No A fib or A flutter  EMMI video to pt or via MyChart  COVID 19 guidelines implemented in PV today with Pt and RN  Pt is fully vaccinated  for Covid   Miralax prep  Virtual previsit.  Due to the COVID-19 pandemic we are asking patients to follow certain guidelines.  Pt aware of COVID protocols and [facility] guidelines   

## 2021-05-04 ENCOUNTER — Encounter: Payer: PRIVATE HEALTH INSURANCE | Admitting: Gastroenterology

## 2021-05-10 ENCOUNTER — Telehealth: Payer: Self-pay

## 2021-05-10 ENCOUNTER — Other Ambulatory Visit: Payer: Self-pay | Admitting: Nurse Practitioner

## 2021-05-10 DIAGNOSIS — F4323 Adjustment disorder with mixed anxiety and depressed mood: Secondary | ICD-10-CM

## 2021-05-10 MED ORDER — FLUOXETINE HCL 10 MG PO CAPS: 10.0000 mg | ORAL_CAPSULE | Freq: Every day | ORAL | 1 refills | Status: DC

## 2021-05-10 MED ORDER — ALPRAZOLAM 0.5 MG PO TABS: 0.5000 mg | ORAL_TABLET | Freq: Every evening | ORAL | 0 refills | Status: AC | PRN

## 2021-05-10 NOTE — Telephone Encounter (Signed)
Patient said in the past Dr. Audie Box had prescribed for her Prozac and Xanax for mental health issues but she has not taken or needed those meds in a couple of years.  However, she feels like she needs them now to help her "cope with some personal circumstances". She states "my husband of 22 years just walked out on me".

## 2021-05-10 NOTE — Telephone Encounter (Signed)
Per DPR access note on file left detailed message in voice mail with Tiffany's message and informing patient Rx's sent.

## 2021-05-10 NOTE — Telephone Encounter (Signed)
Let her know I am so sorry to hear about her husband. I will restart her Prozac 10 mg and will provide Xanax #30 for her to take very sparingly as needed for anxiety.

## 2021-06-25 ENCOUNTER — Encounter: Payer: Self-pay | Admitting: Gastroenterology

## 2021-08-28 ENCOUNTER — Other Ambulatory Visit: Payer: Self-pay

## 2021-08-28 ENCOUNTER — Ambulatory Visit (AMBULATORY_SURGERY_CENTER): Payer: No Typology Code available for payment source | Admitting: *Deleted

## 2021-08-28 VITALS — Ht 66.5 in | Wt 125.0 lb

## 2021-08-28 DIAGNOSIS — Z1211 Encounter for screening for malignant neoplasm of colon: Secondary | ICD-10-CM

## 2021-08-28 MED ORDER — POLYETHYLENE GLYCOL 3350 17 GM/SCOOP PO POWD: 1.0000 | Freq: Every day | ORAL | 0 refills | Status: DC

## 2021-08-28 NOTE — Progress Notes (Signed)
PV completed over the phone. Pt verified name, DOB, address and insurance during PV today.  °Pt mailed instruction packet with copy of consent form to read and not return, and instructions.  ° °Pt encouraged to call with questions or issues.  °If pt has My chart, procedure instructions sent via My Chart  ° °No egg or soy allergy known to patient  °No issues known to pt with past sedation with any surgeries or procedures °Patient denies ever being told they had issues or difficulty with intubation  °No FH of Malignant Hyperthermia °Pt is not on diet pills °Pt is not on  home 02  °Pt is not on blood thinners  °Pt denies issues with constipation  °No A fib or A flutter ° °Pt is fully vaccinated  for Covid  ° ° °Due to the COVID-19 pandemic we are asking patients to follow certain guidelines in PV and the [facility]   °Pt aware of COVID protocols and [facility] guidelines  ° °

## 2021-08-29 ENCOUNTER — Encounter: Payer: Self-pay | Admitting: Gastroenterology

## 2021-09-11 ENCOUNTER — Encounter: Payer: Self-pay | Admitting: Gastroenterology

## 2021-09-11 ENCOUNTER — Ambulatory Visit (AMBULATORY_SURGERY_CENTER): Payer: No Typology Code available for payment source | Admitting: Gastroenterology

## 2021-09-11 ENCOUNTER — Other Ambulatory Visit: Payer: Self-pay

## 2021-09-11 VITALS — BP 101/42 | HR 50 | Temp 97.5°F | Resp 16 | Ht 66.0 in | Wt 125.0 lb

## 2021-09-11 DIAGNOSIS — Z800 Family history of malignant neoplasm of digestive organs: Secondary | ICD-10-CM

## 2021-09-11 DIAGNOSIS — Z1211 Encounter for screening for malignant neoplasm of colon: Secondary | ICD-10-CM

## 2021-09-11 MED ORDER — SODIUM CHLORIDE 0.9 % IV SOLN: 500.0000 mL | Freq: Once | INTRAVENOUS | Status: DC

## 2021-09-11 NOTE — Progress Notes (Signed)
Report to PACU, RN, vss, BBS= Clear.  

## 2021-09-11 NOTE — Op Note (Signed)
[facility] Patient Name: Cheryl Webster Procedure Date: 09/11/2021 8:30 AM MRN: [id number] Endoscopist: Justice Britain , MD Age: 45 Referring MD:  Date of Birth: 19-Apr-1976 Gender: Female Account #: [id number] Procedure:                Colonoscopy Indications:              Screening for malignant neoplasm in the colon,                            Colon cancer screening in patient at increased                            risk: Family history of 1st-degree relative with                            colon polyps Medicines:                Monitored Anesthesia Care Procedure:                Pre-Anesthesia Assessment:                           - Prior to the procedure, a History and Physical                            was performed, and patient medications and                            allergies were reviewed. The patient's tolerance of                            previous anesthesia was also reviewed. The risks                            and benefits of the procedure and the sedation                            options and risks were discussed with the patient.                            All questions were answered, and informed consent                            was obtained. Prior Anticoagulants: The patient has                            taken no previous anticoagulant or antiplatelet                            agents. ASA Grade Assessment: II - A patient with                            mild systemic disease. After reviewing the risks  and benefits, the patient was deemed in                            satisfactory condition to undergo the procedure.                           After obtaining informed consent, the colonoscope                            was passed under direct vision. Throughout the                            procedure, the patient's blood pressure, pulse, and                            oxygen saturations were monitored continuously. The                             0405 PCF-H190TL Slim SB Colonoscope was introduced                            through the anus and advanced to the 5 cm into the                            ileum. The colonoscopy was somewhat difficult due                            to significant looping. Successful completion of                            the procedure was aided by changing the patient's                            position, using manual pressure initially in the                            suprapubic region then transitioning into the LUQ                            region, straightening and shortening the scope to                            obtain bowel loop reduction and using scope                            torsion. The patient tolerated the procedure. Scope In: 8:45:09 AM Scope Out: 8:59:14 AM Scope Withdrawal Time: 0 hours 8 minutes 37 seconds  Total Procedure Duration: 0 hours 14 minutes 5 seconds  Findings:                 The digital rectal exam findings include                            hemorrhoids. Pertinent negatives include no  palpable rectal lesions.                           The terminal ileum and ileocecal valve appeared                            normal.                           Normal mucosa was found in the entire colon.                           Non-bleeding non-thrombosed external and internal                            hemorrhoids were found during retroflexion, during                            perianal exam and during digital exam. The                            hemorrhoids were Grade II (internal hemorrhoids                            that prolapse but reduce spontaneously). Complications:            No immediate complications. Estimated Blood Loss:     Estimated blood loss: none. Impression:               - Hemorrhoids found on digital rectal exam.                           - The examined portion of the ileum was normal.                            - Normal mucosa in the entire examined colon.                           - Non-bleeding non-thrombosed external and internal                            hemorrhoids. Recommendation:           - The patient will be observed post-procedure,                            until all discharge criteria are met.                           - Discharge patient to home.                           - Patient has a contact number available for                            emergencies. The signs and symptoms of potential  delayed complications were discussed with the                            patient. Return to normal activities tomorrow.                            Written discharge instructions were provided to the                            patient.                           - High fiber diet.                           - Use FiberCon 1-2 tablets PO daily.                           - Continue present medications.                           - Repeat colonoscopy in 10 years for screening                            purposes. If it is found that patient's father had                            an advanced polyp (large polyp or polyp that                            required surgical resection or multiple                            colonoscopies to remove), then would consider a                            5-year follow up (can update our team if that is                            the case).                           - The findings and recommendations were discussed                            with the patient.                           - The findings and recommendations were discussed                            with the patient's family. Justice Britain, MD 09/11/2021 9:09:21 AM

## 2021-09-11 NOTE — Progress Notes (Signed)
Pt's states no medical or surgical changes since previsit or office visit.  ° °CHECK-IN-AER ° °V/S-DT °

## 2021-09-11 NOTE — Patient Instructions (Signed)
No polyps!!  Next colonoscopy in 10 years (5 years if you find that your father had an advanced polyps, please let us know)  Please continue your normal medications  Please read over handouts about hemorrhoids and high fiber diets- use FiberCon 1-2 tablets daily  YOU HAD AN ENDOSCOPIC PROCEDURE TODAY AT THE [facility]:   Refer to the procedure report that was given to you for any specific questions about what was found during the examination.  If the procedure report does not answer your questions, please call your gastroenterologist to clarify.  If you requested that your care partner not be given the details of your procedure findings, then the procedure report has been included in a sealed envelope for you to review at your convenience later.  YOU SHOULD EXPECT: Some feelings of bloating in the abdomen. Passage of more gas than usual.  Walking can help get rid of the air that was put into your GI tract during the procedure and reduce the bloating. If you had a lower endoscopy (such as a colonoscopy or flexible sigmoidoscopy) you may notice spotting of blood in your stool or on the toilet paper. If you underwent a bowel prep for your procedure, you may not have a normal bowel movement for a few days.  Please Note:  You might notice some irritation and congestion in your nose or some drainage.  This is from the oxygen used during your procedure.  There is no need for concern and it should clear up in a day or so.  SYMPTOMS TO REPORT IMMEDIATELY:  Following lower endoscopy (colonoscopy or flexible sigmoidoscopy):  Excessive amounts of blood in the stool  Significant tenderness or worsening of abdominal pains  Swelling of the abdomen that is new, acute  Fever of 100F or higher  For urgent or emergent issues, a gastroenterologist can be reached at any hour by calling [phone]. Do not use MyChart messaging for urgent concerns.    DIET:  We do recommend a small meal at first,  but then you may proceed to your regular diet.  Drink plenty of fluids but you should avoid alcoholic beverages for 24 hours.  ACTIVITY:  You should plan to take it easy for the rest of today and you should NOT DRIVE or use heavy machinery until tomorrow (because of the sedation medicines used during the test).    FOLLOW UP: Our staff will call the number listed on your records 48-72 hours following your procedure to check on you and address any questions or concerns that you may have regarding the information given to you following your procedure. If we do not reach you, we will leave a message.  We will attempt to reach you two times.  During this call, we will ask if you have developed any symptoms of COVID 19. If you develop any symptoms (ie: fever, flu-like symptoms, shortness of breath, cough etc.) before then, please call [phone].  If you test positive for Covid 19 in the 2 weeks post procedure, please call and report this information to [place].     SIGNATURES/CONFIDENTIALITY: You and/or your care partner have signed paperwork which will be entered into your electronic medical record.  These signatures attest to the fact that that the information above on your After Visit Summary has been reviewed and is understood.  Full responsibility of the confidentiality of this discharge information lies with you and/or your care-partner.

## 2021-09-11 NOTE — Progress Notes (Signed)
GASTROENTEROLOGY PROCEDURE H&P NOTE   Primary Care Physician: Pcp, No  HPI: Cheryl Webster is a 45 y.o. female who presents for colonoscopy for screening purposes.  Past Medical History:  Diagnosis Date   Allergy    Anxiety    ASCUS of cervix with negative high risk HPV 11/2017   Breast cyst right   Frequent headaches    pre menopause causing headaches   Past Surgical History:  Procedure Laterality Date   IUD REMOVAL     TEMPOROMANDIBULAR JOINT SURGERY  AGE13   THERAPEUTIC ABORTION  AGE 24, 21   TONSILLECTOMY AND ADENOIDECTOMY  AGE 67   TYMPANOSTOMY TUBE PLACEMENT  AGE 30s   wire removed from TMJ surgery     age 11   Current Outpatient Medications  Medication Sig Dispense Refill   ALPRAZolam (XANAX) 0.5 MG tablet Take 1 tablet (0.5 mg total) by mouth at bedtime as needed for anxiety. 30 tablet 0   Cholecalciferol (VITAMIN D PO) Take by mouth.     Cranberry Extract 200 MG CAPS Take by mouth.     FLUoxetine (PROZAC) 10 MG capsule Take 1 capsule (10 mg total) by mouth daily. (Patient not taking: Reported on 08/28/2021) 90 capsule 1   fluticasone (FLONASE) 50 MCG/ACT nasal spray Place into the nose. (Patient not taking: Reported on 08/28/2021)     Lactobacillus (ACIDOPHILUS PO) Take by mouth.     levocetirizine (XYZAL) 5 MG tablet Take 5 mg by mouth every evening. (Patient not taking: Reported on 08/28/2021)     MAGNESIUM PO Take by mouth.     Multiple Vitamins-Iron (MULTIVITAMIN/IRON PO) Take by mouth.     Multiple Vitamins-Minerals (AIRBORNE PO) Take by mouth.     norethindrone-ethinyl estradiol (JUNEL 1/20) 1-20 MG-MCG tablet TAKE 1 ACTIVE TABLET BY  MOUTH DAILY CONTINUOUSLY AS DIRECTED 84 tablet 3   polyethylene glycol powder (GLYCOLAX/MIRALAX) 17 GM/SCOOP powder Take 255 g by mouth daily. 255 g 0   vitamin B-12 (CYANOCOBALAMIN) 1000 MCG tablet Take 1,000 mcg by mouth daily.     Current Facility-Administered Medications  Medication Dose Route Frequency Provider  Last Rate Last Admin   0.9 %  sodium chloride infusion  500 mL Intravenous Once Mansouraty, Netty Starring., MD        Current Outpatient Medications:    ALPRAZolam (XANAX) 0.5 MG tablet, Take 1 tablet (0.5 mg total) by mouth at bedtime as needed for anxiety., Disp: 30 tablet, Rfl: 0   Cholecalciferol (VITAMIN D PO), Take by mouth., Disp: , Rfl:    Cranberry Extract 200 MG CAPS, Take by mouth., Disp: , Rfl:    FLUoxetine (PROZAC) 10 MG capsule, Take 1 capsule (10 mg total) by mouth daily. (Patient not taking: Reported on 08/28/2021), Disp: 90 capsule, Rfl: 1   fluticasone (FLONASE) 50 MCG/ACT nasal spray, Place into the nose. (Patient not taking: Reported on 08/28/2021), Disp: , Rfl:    Lactobacillus (ACIDOPHILUS PO), Take by mouth., Disp: , Rfl:    levocetirizine (XYZAL) 5 MG tablet, Take 5 mg by mouth every evening. (Patient not taking: Reported on 08/28/2021), Disp: , Rfl:    MAGNESIUM PO, Take by mouth., Disp: , Rfl:    Multiple Vitamins-Iron (MULTIVITAMIN/IRON PO), Take by mouth., Disp: , Rfl:    Multiple Vitamins-Minerals (AIRBORNE PO), Take by mouth., Disp: , Rfl:    norethindrone-ethinyl estradiol (JUNEL 1/20) 1-20 MG-MCG tablet, TAKE 1 ACTIVE TABLET BY  MOUTH DAILY CONTINUOUSLY AS DIRECTED, Disp: 84 tablet, Rfl: 3   polyethylene glycol  powder (GLYCOLAX/MIRALAX) 17 GM/SCOOP powder, Take 255 g by mouth daily., Disp: 255 g, Rfl: 0   vitamin B-12 (CYANOCOBALAMIN) 1000 MCG tablet, Take 1,000 mcg by mouth daily., Disp: , Rfl:   Current Facility-Administered Medications:    0.9 %  sodium chloride infusion, 500 mL, Intravenous, Once, Mansouraty, Netty Starring., MD Allergies  Allergen Reactions   Bee Venom Swelling   Sulfa Antibiotics Hives   Family History  Problem Relation Age of Onset   Colon polyps Father    Hypertension Father    Hyperlipidemia Father    Cancer Father        Prostate   Colon cancer Maternal Grandmother    Breast cancer Maternal Grandmother        Postmenopausal    Rectal cancer Maternal Grandmother    Heart disease Paternal Grandmother    Heart failure Paternal Grandmother    Esophageal cancer Neg Hx    Stomach cancer Neg Hx    Social History   Socioeconomic History   Marital status: Married    Spouse name: Not on file   Number of children: 1   Years of education: Not on file   Highest education level: Not on file  Occupational History   Occupation: Photographer: [employer]    Comment: ENGLISH  Tobacco Use   Smoking status: Never    Passive exposure: Past (2 years at age [age]. waiting tables x 4 years)   Smokeless tobacco: Never  Vaping Use   Vaping Use: Never used  Substance and Sexual Activity   Alcohol use: Yes    Comment: Rare, less than monthly   Drug use: Not Currently    Comment: HX OF MARIJUANA   Sexual activity: Yes    Birth control/protection: Other-see comments, Pill    Comment: Vasectomy, 1st intercourse 55 yo, < 5 partners  Other Topics Concern   Not on file  Social History Narrative   Not on file   Social Determinants of Health   Financial Resource Strain: Not on file  Food Insecurity: Not on file  Transportation Needs: Not on file  Physical Activity: Not on file  Stress: Not on file  Social Connections: Not on file  Intimate Partner Violence: Not on file    Physical Exam: Today's Vitals   09/11/21 0814 09/11/21 0820  BP: 121/73   Pulse: 67   Temp: (!) 97.5 F (36.4 C) (!) 97.5 F (36.4 C)  SpO2: 98%   Weight: 125 lb (56.7 kg)   Height: 5\' 6"  (1.676 m)    Body mass index is 20.18 kg/m. GEN: NAD EYE: Sclerae anicteric ENT: MMM CV: Non-tachycardic GI: Soft, NT/ND NEURO:  Alert & Oriented x 3  Lab Results: No results for input(s): WBC, HGB, HCT, PLT in the last 72 hours. BMET No results for input(s): NA, K, CL, CO2, GLUCOSE, BUN, CREATININE, CALCIUM in the last 72 hours. LFT No results for input(s): PROT, ALBUMIN, AST, ALT, ALKPHOS, BILITOT, BILIDIR, IBILI in the last 72  hours. PT/INR No results for input(s): LABPROT, INR in the last 72 hours.   Impression / Plan: This is a 45 y.o.female who presents for colonoscopy for screening.  The risks and benefits of endoscopic evaluation/treatment were discussed with the patient and/or family; these include but are not limited to the risk of perforation, infection, bleeding, missed lesions, lack of diagnosis, severe illness requiring hospitalization, as well as anesthesia and sedation related illnesses.  The patient's history has been reviewed, patient  examined, no change in status, and deemed stable for procedure.  The patient and/or family is agreeable to proceed.    Justice Britain, MD [facility] Office # [phone]

## 2021-09-13 ENCOUNTER — Telehealth: Payer: Self-pay

## 2021-09-13 NOTE — Telephone Encounter (Signed)
°  Follow up Call-  Call back number 09/11/2021  Post procedure Call Back phone  # [phone]  Permission to leave phone message Yes  Some recent data might be hidden     Patient questions:  Do you have a fever, pain , or abdominal swelling? No. Pain Score  0 *  Have you tolerated food without any problems? Yes.    Have you been able to return to your normal activities? Yes.    Do you have any questions about your discharge instructions: Diet   No. Medications  No. Follow up visit  No.  Do you have questions or concerns about your Care? No.  Actions: * If pain score is 4 or above: No action needed, pain <4.  Have you developed a fever since your procedure? no  2.   Have you had an respiratory symptoms (SOB or cough) since your procedure? nono  3.   Have you tested positive for COVID 19 since your procedure no  4.   Have you had any family members/close contacts diagnosed with the COVID 19 since your procedure?  no   If yes to any of these questions please route to Laverna Peace, RN and Karlton Lemon, RN

## 2021-11-20 IMAGING — MG MM DIGITAL SCREENING BILAT W/ TOMO AND CAD
8 series · 9 of 24 positions shown · non-contrast
Comparison: Previous exam(s).

CLINICAL DATA: Screening.

EXAM:
DIGITAL SCREENING BILATERAL MAMMOGRAM WITH TOMOSYNTHESIS AND CAD
TECHNIQUE: Bilateral screening digital craniocaudal and mediolateral oblique
mammograms were obtained. Bilateral screening digital breast
tomosynthesis was performed. The images were evaluated with
computer-aided detection.

[L CC synth-2D]
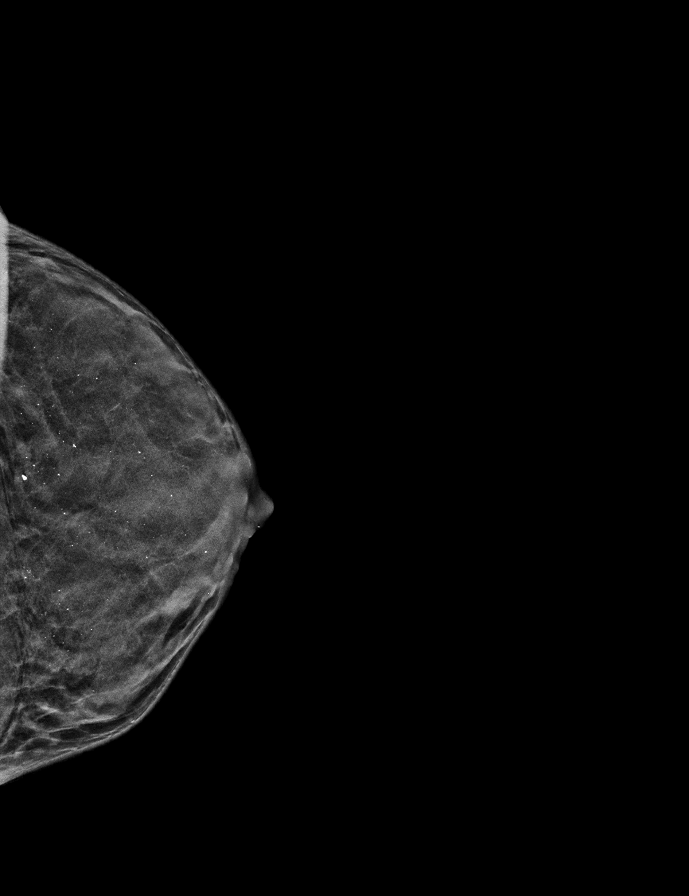

[R MLO synth-2D]
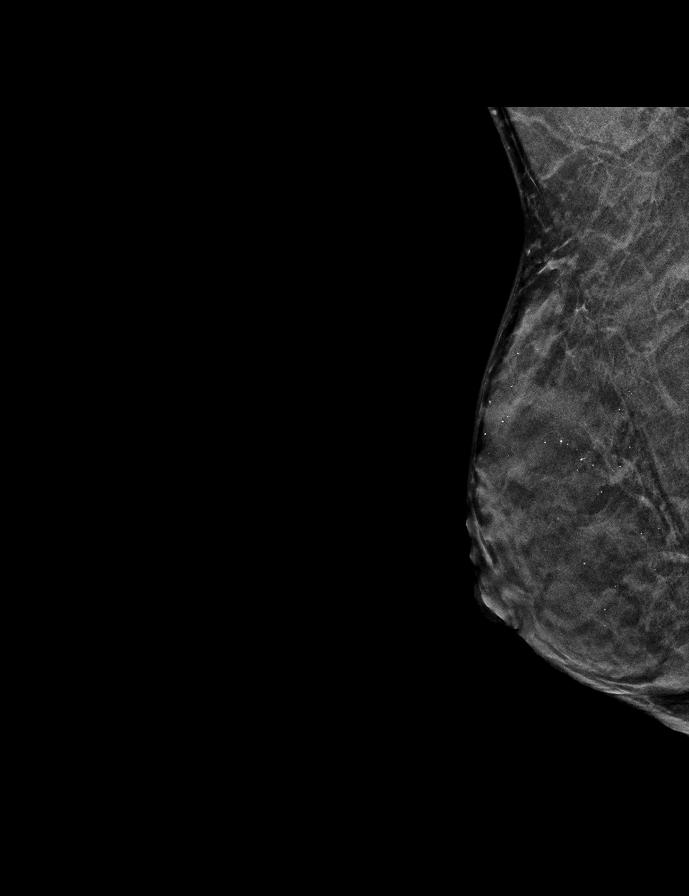

[R CC synth-2D]
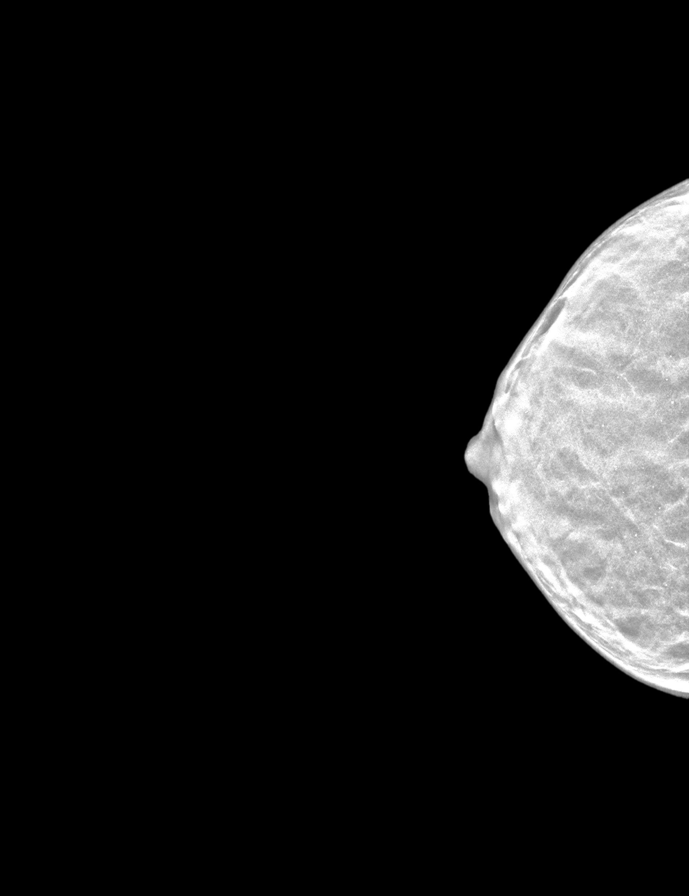

[L MLO synth-2D]
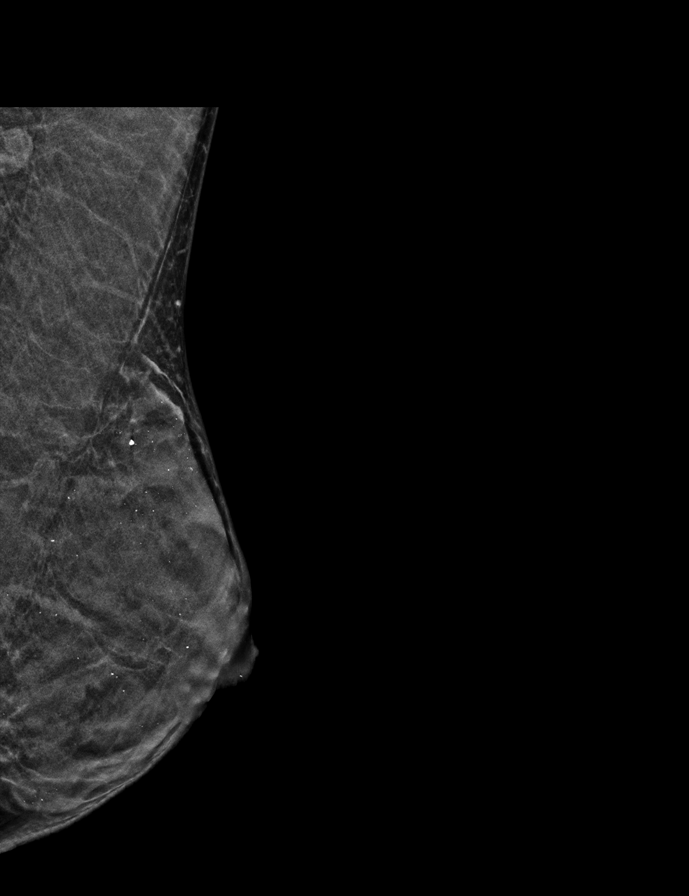

[R MLO tomo · 2 of 34 frames shown]
[frame 12/34]
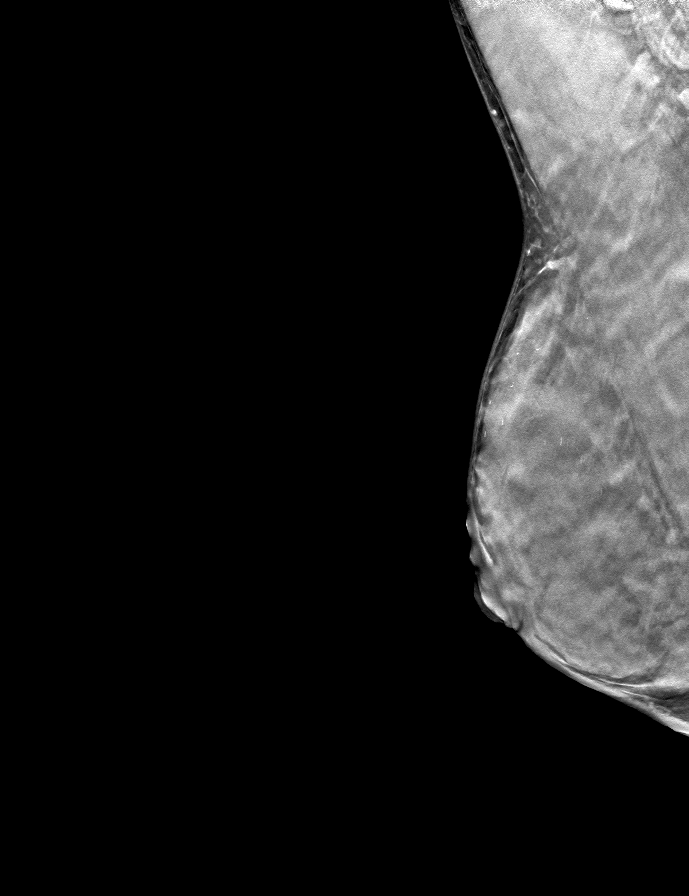
[frame 17/34]
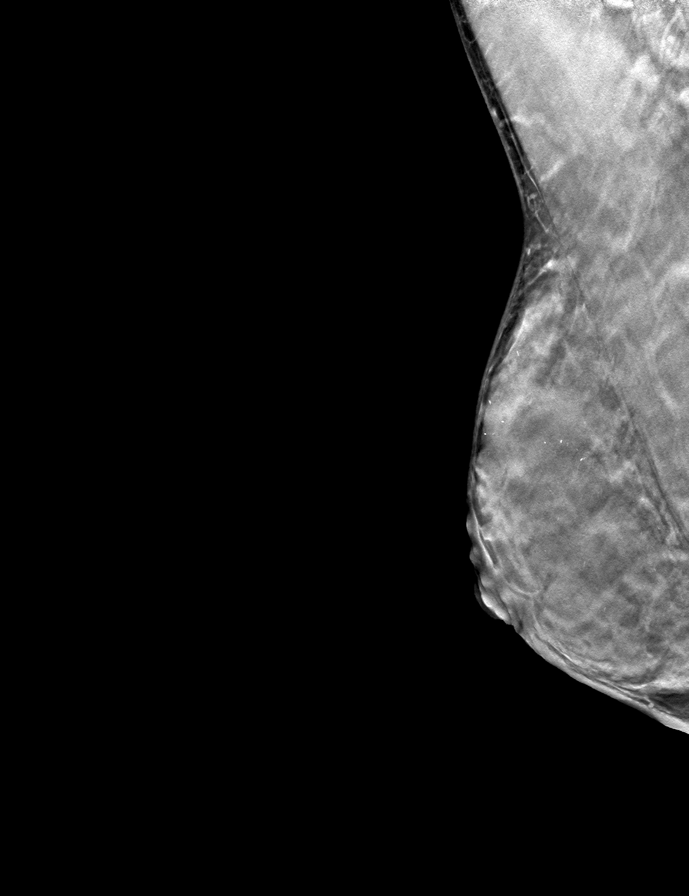

[R CC tomo · tomo slice 15/28.0]
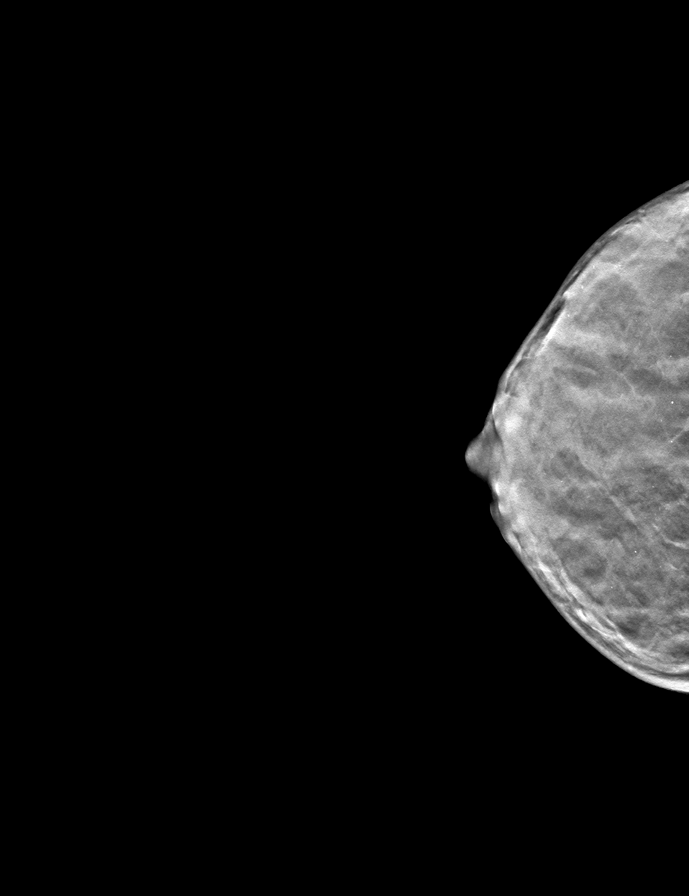

[L CC tomo · tomo slice 15/30.0]
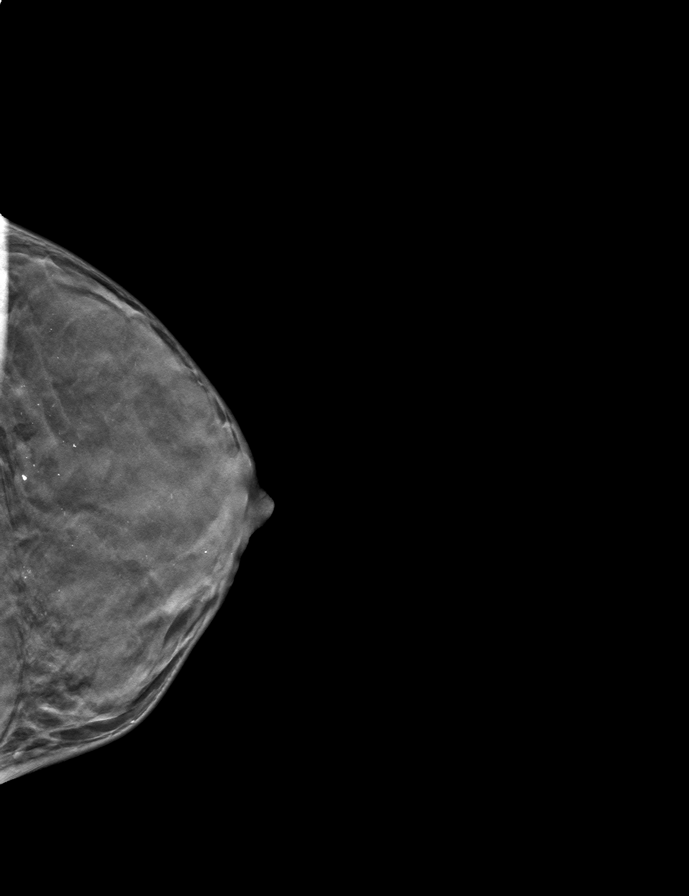

[L MLO tomo · tomo slice 17/33.0]
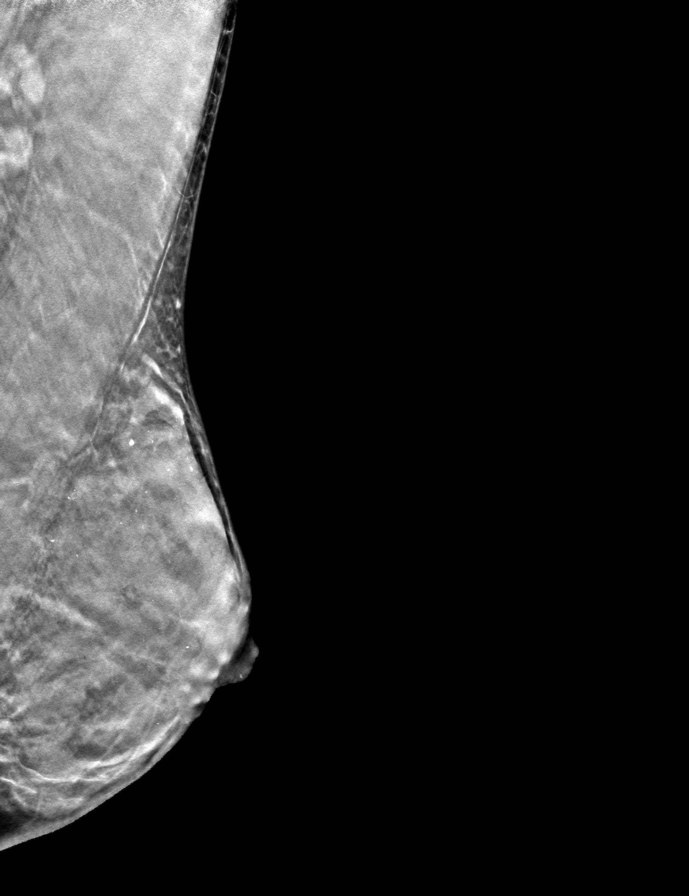

[9 of 24 positions shown; findings below may reference images not displayed]

ACR Breast Density Category d: The breast tissue is extremely dense,
which lowers the sensitivity of mammography
FINDINGS: There are no findings suspicious for malignancy.
IMPRESSION: No mammographic evidence of malignancy. A result letter of this
screening mammogram will be mailed directly to the patient.

RECOMMENDATION:
Screening mammogram in one year. (Code:TA-V-WV9)

BI-RADS CATEGORY  1: Negative.

## 2022-03-03 ENCOUNTER — Other Ambulatory Visit: Payer: Self-pay | Admitting: Nurse Practitioner

## 2022-03-04 ENCOUNTER — Other Ambulatory Visit: Payer: Self-pay | Admitting: Nurse Practitioner

## 2022-03-05 NOTE — Telephone Encounter (Signed)
Medication refill request: junel 1/20 Last AEX:  03-05-21 Next AEX: 03-07-22 Last MMG (if hormonal medication request): 04-17-21 category d density birads 1:neg Refill authorized: rx request came In from mail order pharmacy. Please approve rx if appropriate

## 2022-03-07 ENCOUNTER — Encounter: Payer: Self-pay | Admitting: Nurse Practitioner

## 2022-03-07 ENCOUNTER — Ambulatory Visit (INDEPENDENT_AMBULATORY_CARE_PROVIDER_SITE_OTHER): Payer: No Typology Code available for payment source | Admitting: Nurse Practitioner

## 2022-03-07 VITALS — BP 118/76 | Ht 66.0 in | Wt 136.0 lb

## 2022-03-07 DIAGNOSIS — Z3041 Encounter for surveillance of contraceptive pills: Secondary | ICD-10-CM | POA: Diagnosis not present

## 2022-03-07 DIAGNOSIS — Z833 Family history of diabetes mellitus: Secondary | ICD-10-CM | POA: Diagnosis not present

## 2022-03-07 DIAGNOSIS — Z01419 Encounter for gynecological examination (general) (routine) without abnormal findings: Secondary | ICD-10-CM

## 2022-03-07 DIAGNOSIS — Z91038 Other insect allergy status: Secondary | ICD-10-CM

## 2022-03-07 DIAGNOSIS — E785 Hyperlipidemia, unspecified: Secondary | ICD-10-CM | POA: Diagnosis not present

## 2022-03-07 NOTE — Progress Notes (Signed)
Cheryl Webster 1976-07-21 [id number]   History:  46 y.o. K2H0623 presents for annual exam. Amenorrheic/OCPs continuously. Normal pap and mammogram history. Would like referral to an allergist. 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 Also needs referral to eye doctor per insurance for standard eye exam.   Gynecologic History No LMP recorded (lmp unknown). (Menstrual status: Oral contraceptives).   Contraception/Family planning: OCP (estrogen/progesterone) Sexually active: Yes  Health Maintenance Last Pap: 03/02/2020. Results were: Normal, 3-year repeat Last mammogram: 04/17/2021. Results were: Normal Last colonoscopy: 09/11/2021. Results were: Normal, 10-year recall Last Dexa: Not indicated  Past medical history, past surgical history, family history and social history were all reviewed and documented in the EPIC chart. 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 7 yo daughter - going to [employer] in the fall.   ROS:  A ROS was performed and pertinent positives and negatives are included.  Exam:  Vitals:   03/07/22 1332  BP: 118/76  Weight: 136 lb (61.7 kg)  Height: 5\' 6"  (1.676 m)    Body mass index is 21.95 kg/m.  General appearance:  Normal Thyroid:  Symmetrical, normal in size, without palpable masses or nodularity. Respiratory  Auscultation:  Clear without wheezing or rhonchi Cardiovascular  Auscultation:  Regular rate, without rubs, murmurs or gallops  Edema/varicosities:  Not grossly evident Abdominal  Soft,nontender, without masses, guarding or rebound.  Liver/spleen:  No organomegaly noted  Hernia:  None appreciated  Skin  Inspection:  Grossly normal Breasts: Examined lying and sitting.   Right: Without masses, retractions, nipple discharge or axillary adenopathy.   Left: Without masses, retractions, nipple discharge or axillary adenopathy. Genitourinary   Inguinal/mons:  Normal  without inguinal adenopathy  External genitalia:  Normal appearing vulva with no masses, tenderness, or lesions  BUS/Urethra/Skene's glands:  Normal  Vagina:  Normal appearing with normal color and discharge, no lesions  Cervix:  Normal appearing without discharge or lesions  Uterus:  Normal in size, shape and contour.  Midline and mobile, nontender  Adnexa/parametria:     Rt: Normal in size, without masses or tenderness.   Lt: Normal in size, without masses or tenderness.  Anus and perineum: Normal  Digital rectal exam: Normal sphincter tone without palpated masses or tenderness  Patient informed chaperone available to be present for breast and pelvic exam. Patient has requested no chaperone to be present. Patient has been advised what will be completed during breast and pelvic exam.   Assessment/Plan:  46 y.o. 49 for annual exam.   Well female exam with routine gynecological exam - Plan: CBC with Differential/Platelet, Comprehensive metabolic panel. Education provided on SBEs, importance of preventative screenings, current guidelines, high calcium diet, regular exercise, and multivitamin daily. Requesting referral to eye doctor for routine eye exam.   Hyperlipidemia, unspecified hyperlipidemia type - Plan: Lipid panel  Encounter for surveillance of contraceptive pills - Junel 1/20 mg-mcg daily. Takes continuously. Does not need refill at this time. Will send when needed.   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 Insurance requires referral per patient.   Screening for cervical cancer - Normal Pap history.  Will repeat at 3-year interval per guidelines.  Screening for breast cancer - Normal mammogram history.  Continue annual screenings.  Normal breast exam today.  Screening for  colon cancer - 08/2021 colonoscopy. Will repeat at 10-year interval per GI recommendation.   Return in 1 year for annual.    Olivia Mackie DNP, 1:57 PM 03/07/2022

## 2022-03-08 ENCOUNTER — Telehealth: Payer: Self-pay

## 2022-03-08 DIAGNOSIS — Z0100 Encounter for examination of eyes and vision without abnormal findings: Secondary | ICD-10-CM

## 2022-03-08 DIAGNOSIS — Z91030 Bee allergy status: Secondary | ICD-10-CM

## 2022-03-08 LAB — COMPREHENSIVE METABOLIC PANEL
AG Ratio: 1.6 (calc) (ref 1.0–2.5)
ALT: 20 U/L (ref 6–29)
AST: 23 U/L (ref 10–35)
Albumin: 4.4 g/dL (ref 3.6–5.1)
Alkaline phosphatase (APISO): 43 U/L (ref 31–125)
BUN/Creatinine Ratio: 7 (calc) (ref 6–22)
BUN: 7 mg/dL (ref 7–25)
CO2: 24 mmol/L (ref 20–32)
Calcium: 10 mg/dL (ref 8.6–10.2)
Chloride: 104 mmol/L (ref 98–110)
Creat: 1.02 mg/dL — ABNORMAL HIGH (ref 0.50–0.99)
Globulin: 2.7 g/dL (calc) (ref 1.9–3.7)
Glucose, Bld: 86 mg/dL (ref 65–99)
Potassium: 5.4 mmol/L — ABNORMAL HIGH (ref 3.5–5.3)
Sodium: 138 mmol/L (ref 135–146)
Total Bilirubin: 0.4 mg/dL (ref 0.2–1.2)
Total Protein: 7.1 g/dL (ref 6.1–8.1)

## 2022-03-08 LAB — CBC WITH DIFFERENTIAL/PLATELET
Absolute Monocytes: 369 cells/uL (ref 200–950)
Basophils Absolute: 73 cells/uL (ref 0–200)
Basophils Relative: 1.4 %
Eosinophils Absolute: 78 cells/uL (ref 15–500)
Eosinophils Relative: 1.5 %
HCT: 42.4 % (ref 35.0–45.0)
Hemoglobin: 14.4 g/dL (ref 11.7–15.5)
Lymphs Abs: 2179 cells/uL (ref 850–3900)
MCH: 32.7 pg (ref 27.0–33.0)
MCHC: 34 g/dL (ref 32.0–36.0)
MCV: 96.1 fL (ref 80.0–100.0)
MPV: 10 fL (ref 7.5–12.5)
Monocytes Relative: 7.1 %
Neutro Abs: 2501 cells/uL (ref 1500–7800)
Neutrophils Relative %: 48.1 %
Platelets: 355 10*3/uL (ref 140–400)
RBC: 4.41 10*6/uL (ref 3.80–5.10)
RDW: 12 % (ref 11.0–15.0)
Total Lymphocyte: 41.9 %
WBC: 5.2 10*3/uL (ref 3.8–10.8)

## 2022-03-08 LAB — LIPID PANEL
Cholesterol: 183 mg/dL (ref <200)
HDL: 67 mg/dL (ref >=50)
LDL Cholesterol (Calc): 100 mg/dL (calc) — ABNORMAL HIGH
Non-HDL Cholesterol (Calc): 116 mg/dL (calc) (ref <130)
Total CHOL/HDL Ratio: 2.7 (calc) (ref <5.0)
Triglycerides: 73 mg/dL (ref <150)

## 2022-03-08 LAB — HEMOGLOBIN A1C
Hgb A1c MFr Bld: 5.3 % of total Hgb (ref <5.7)
Mean Plasma Glucose: 105 mg/dL
eAG (mmol/L): 5.8 mmol/L

## 2022-03-08 NOTE — Telephone Encounter (Signed)
-----   Message from Olivia Mackie, NP sent at 03/07/2022  2:00 PM EDT ----- Regarding: Referrals Patient requesting referral to allergist for allergy to bee venom. She would also like referral to eye doctor for standard eye exam.

## 2022-03-08 NOTE — Telephone Encounter (Signed)
Both referrals sent.

## 2022-03-11 ENCOUNTER — Other Ambulatory Visit: Payer: Self-pay | Admitting: Nurse Practitioner

## 2022-03-11 DIAGNOSIS — Z1231 Encounter for screening mammogram for malignant neoplasm of breast: Secondary | ICD-10-CM

## 2022-03-12 NOTE — Telephone Encounter (Signed)
FYI. Allergy appt scheduled for 05/03/22.   Spoke with Jasmine December @ [facility] and she states she plans to verify the pt's insurance and give her a call today to schedule. Will notify us whether or not the pt gets scheduled.

## 2022-03-20 NOTE — Telephone Encounter (Signed)
FYI. Called pt to see if she was able to get appt with eye care center. She states unfortunately not due to being out of network. Pt reports she needs to get in touch with insurance to find out exactly what her current plan covers and where.   Pt notified that if there was anything else she needed of [place], then to let us know. She voiced understanding.

## 2022-04-18 ENCOUNTER — Ambulatory Visit
Admission: RE | Admit: 2022-04-18 | Discharge: 2022-04-18 | Disposition: A | Discharge status: Home / Self Care | Payer: No Typology Code available for payment source | Source: Ambulatory Visit | Attending: Nurse Practitioner | Admitting: Nurse Practitioner

## 2022-04-18 DIAGNOSIS — Z1231 Encounter for screening mammogram for malignant neoplasm of breast: Secondary | ICD-10-CM

## 2022-05-03 ENCOUNTER — Encounter: Payer: Self-pay | Admitting: Internal Medicine

## 2022-05-03 ENCOUNTER — Ambulatory Visit: Payer: No Typology Code available for payment source | Admitting: Internal Medicine

## 2022-05-03 VITALS — BP 116/72 | HR 60 | Temp 97.8°F | Resp 18 | Ht 66.0 in | Wt 134.2 lb

## 2022-05-03 DIAGNOSIS — T783XXA Angioneurotic edema, initial encounter: Secondary | ICD-10-CM

## 2022-05-03 MED ORDER — EPINEPHRINE 0.3 MG/0.3ML IJ SOAJ: 0.3000 mg | INTRAMUSCULAR | 1 refills | Status: AC | PRN

## 2022-05-03 MED ORDER — LEVOCETIRIZINE DIHYDROCHLORIDE 5 MG PO TABS: 5.0000 mg | ORAL_TABLET | Freq: Every evening | ORAL | 1 refills | Status: DC

## 2022-05-03 NOTE — Progress Notes (Signed)
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 She is a 46 y.o. female, who is referred here by Pcp, No for the evaluation of bee allergy.  History obtained from patient  and  chart review  .  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  Reactions started approximately 8 years ago.  Reactions are becoming larger with each subsequent stings.  She requires corticosteroid injection for treatment.  She is interested in venom immunotherapy.  She denies any systemic symptoms, however has been stung through her protective clothing.  Assessment and Plan: Samary is a 46 y.o. 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  Don't hesitate to reach out if you have any additional concerns!  Ferol Luz, MD  [facility] [place] [place]  No follow-ups on file.  No orders of the defined  types were placed in this encounter.  Lab Orders         Allergen Hymenoptera Panel      Other allergy screening: Asthma: no Rhino conjunctivitis: no Food allergy: no Medication allergy: no Hymenoptera allergy: yes Urticaria: no Eczema:no History of recurrent infections suggestive of immunodeficency: no  Diagnostics: None performed   Past Medical History: Patient Active Problem List   Diagnosis Date Noted   GAD (generalized anxiety disorder), controlled with Prozac and counseling 08/22/2018   Perimenopausal symptoms, on OCP, followed by Dr. 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, bilateral TMJ 08/22/2018   Past Medical History:  Diagnosis Date   Allergy    Anxiety    ASCUS of cervix with negative high risk HPV 11/2017   Breast cyst right   Frequent headaches    pre menopause causing headaches   Past Surgical History: Past Surgical History:  Procedure Laterality Date   IUD REMOVAL     TEMPOROMANDIBULAR JOINT SURGERY  AGE13   THERAPEUTIC ABORTION  AGE 29, 21   TONSILLECTOMY AND ADENOIDECTOMY  AGE 54   TYMPANOSTOMY TUBE PLACEMENT  AGE 30s   wire removed from TMJ surgery     age 54   Medication List:  Current Outpatient  Medications  Medication Sig Dispense Refill   Cholecalciferol (VITAMIN D PO) Take by mouth.     Cranberry Extract 200 MG CAPS Take by mouth.     fluticasone (FLONASE) 50 MCG/ACT nasal spray Place into the nose.     Lactobacillus (ACIDOPHILUS PO) Take by mouth.     levocetirizine (XYZAL) 5 MG tablet Take 5 mg by mouth every evening.     MAGNESIUM PO Take by mouth.     Multiple Vitamins-Iron (MULTIVITAMIN/IRON PO) Take by mouth.      Multiple Vitamins-Minerals (AIRBORNE PO) Take by mouth.     norethindrone-ethinyl estradiol (JUNEL 1/20) 1-20 MG-MCG tablet TAKE 1 ACTIVE TABLET BY  MOUTH DAILY CONTINUOUSLY AS DIRECTED 84 tablet 0   vitamin B-12 (CYANOCOBALAMIN) 1000 MCG tablet Take 1,000 mcg by mouth daily.     ALPRAZolam (XANAX) 0.5 MG tablet Take 1 tablet (0.5 mg total) by mouth at bedtime as needed for anxiety. (Patient not taking: Reported on 03/07/2022) 30 tablet 0   FLUoxetine (PROZAC) 10 MG capsule Take 1 capsule (10 mg total) by mouth daily. (Patient not taking: Reported on 08/28/2021) 90 capsule 1   No current facility-administered medications for this visit.   Allergies: Allergies  Allergen Reactions   Bee Venom Swelling   Sulfa Antibiotics Hives   Social History: Social History   Socioeconomic History   Marital status: Married    Spouse name: Not on file   Number of children: 1   Years of education: Not on file   Highest education level: Not on file  Occupational History   Occupation: Photographer: [employer]    Comment: ENGLISH  Tobacco Use   Smoking status: Never    Passive exposure: Past (2 years at age [age]. waiting tables x 4 years)   Smokeless tobacco: Never  Vaping Use   Vaping Use: Never used  Substance and Sexual Activity   Alcohol use: Yes    Comment: Rare, less than monthly   Drug use: Not Currently    Comment: HX OF MARIJUANA   Sexual activity: Not Currently    Birth control/protection: Pill    Comment: 1st intercourse 36 yo, < 5 partners  Other Topics Concern   Not on file  Social History Narrative   Not on file   Social Determinants of Health   Financial Resource Strain: Not on file  Food Insecurity: Not on file  Transportation Needs: Not on file  Physical Activity: Not on file  Stress: Not on file  Social Connections: Not on file   Lives in a single-family home that is built in [place].  There are no roaches in the house and bed is 2 feet off floor.  There  are no dust mite precautions on better pillows.  She is not exposed to fumes, chemicals or dust.  But she does have a beekeeping and chicken keeping hobby and she is exposed to chemicals and dust through this.  She does not have a HEPA filter in her home and home is not near and interstate industrial area. Smoking: No exposure Occupation: Professor  Environmental History: Immunologist in the house: yes Carpet in the family room: no Carpet in the bedroom: no Heating:  Steam furnace Cooling: central Pet: yes cats located inside the house with access to bedroom, chickens outside the home  Family History: Family History  Problem Relation Age of Onset   Colon polyps Father    Hypertension Father    Hyperlipidemia Father  Cancer Father        Prostate   Colon cancer Maternal Grandmother    Breast cancer Maternal Grandmother        Postmenopausal   Rectal cancer Maternal Grandmother    Heart disease Paternal Grandmother    Heart failure Paternal Grandmother    Esophageal cancer Neg Hx    Stomach cancer Neg Hx      ROS: All others negative except as noted per HPI.   Objective: BP 116/72   Pulse 60   Temp 97.8 F (36.6 C)   Resp 18   Ht 5\' 6"  (1.676 m)   Wt 134 lb 4 oz (60.9 kg)   SpO2 99%   BMI 21.67 kg/m  Body mass index is 21.67 kg/m.  General Appearance:  Alert, cooperative, no distress, appears stated age  Head:  Normocephalic, without obvious abnormality, atraumatic  Eyes:  Conjunctiva clear, EOM's intact  Nose: Nares normal,   Throat: Lips, tongue normal; teeth and gums normal,   Neck: Supple, symmetrical  Lungs:   , Respirations unlabored, no coughing  Heart:  regular rate and rhythm and no murmur, Appears well perfused  Extremities: No edema  Skin: Skin color, texture, turgor normal, no rashes or lesions on visualized portions of skin  Neurologic: No gross deficits   The plan was reviewed with the patient/family, and all questions/concerned were  addressed.  It was my pleasure to see Teigen today and participate in her care. Please feel free to contact me with any questions or concerns.  Sincerely,  Revonda Standard, MD Allergy & Immunology  [facility] office: [phone] [facility] office: [phone]

## 2022-05-03 NOTE — Patient Instructions (Addendum)
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  Don't hesitate to reach out if you have any additional concerns!  Ferol Luz, MD  [facility] [place] [place]

## 2022-05-07 LAB — ALLERGEN HYMENOPTERA PANEL
Bumblebee: 1.65 kU/L — AB
Honeybee IgE: 49.3 kU/L — AB
Hornet, White Face, IgE: 0.77 kU/L — AB
Hornet, Yellow, IgE: 0.37 kU/L — AB
Paper Wasp IgE: 0.12 kU/L — AB
Yellow Jacket, IgE: 5.15 kU/L — AB

## 2022-05-07 NOTE — Addendum Note (Signed)
Addended by: Ralph Leyden on: 05/07/2022 04:12 PM   Modules accepted: Orders

## 2022-05-07 NOTE — Progress Notes (Signed)
Blood work returned positive to all 5 hymenoptera.  We will plan to proceed with standard build up for VIT. Please let patient know.  I believe she has already scheduled first Vit appointment.

## 2022-05-16 ENCOUNTER — Ambulatory Visit (INDEPENDENT_AMBULATORY_CARE_PROVIDER_SITE_OTHER): Payer: No Typology Code available for payment source | Admitting: *Deleted

## 2022-05-16 DIAGNOSIS — Z91038 Other insect allergy status: Secondary | ICD-10-CM

## 2022-05-16 NOTE — Progress Notes (Signed)
Immunotherapy   Patient Details  Name: Cheryl Webster MRN: [id number] Date of Birth: [birth year]  05/16/2022  Cheryl Webster started injections for  HB, MV, Wasp  Frequency: Weekly  Epi-Pen: Yes Consent signed and patient instructions given. Patient started venom injections and received 0.05 of 0.003 MV in the RUA, 0.05 of 0.001 HB in the LUA and 0.05 of 0.001 Wasp in the LLA. Patient waited 30 minutes and did not experience any issues.    Cheryl Webster 05/16/2022, 3:19 PM

## 2022-05-23 ENCOUNTER — Ambulatory Visit (INDEPENDENT_AMBULATORY_CARE_PROVIDER_SITE_OTHER): Payer: No Typology Code available for payment source

## 2022-05-23 DIAGNOSIS — Z91038 Other insect allergy status: Secondary | ICD-10-CM

## 2022-05-24 ENCOUNTER — Ambulatory Visit: Payer: No Typology Code available for payment source | Admitting: Internal Medicine

## 2022-05-28 ENCOUNTER — Ambulatory Visit (INDEPENDENT_AMBULATORY_CARE_PROVIDER_SITE_OTHER): Payer: No Typology Code available for payment source

## 2022-05-28 DIAGNOSIS — Z91038 Other insect allergy status: Secondary | ICD-10-CM | POA: Diagnosis not present

## 2022-05-28 NOTE — Telephone Encounter (Signed)
Left another voice message for patient to call the office back in regards to her unread lab result notes and mychart message.

## 2022-06-04 ENCOUNTER — Ambulatory Visit (INDEPENDENT_AMBULATORY_CARE_PROVIDER_SITE_OTHER): Payer: No Typology Code available for payment source | Admitting: *Deleted

## 2022-06-04 DIAGNOSIS — Z91038 Other insect allergy status: Secondary | ICD-10-CM

## 2022-06-06 ENCOUNTER — Ambulatory Visit: Payer: No Typology Code available for payment source

## 2022-06-12 ENCOUNTER — Ambulatory Visit (INDEPENDENT_AMBULATORY_CARE_PROVIDER_SITE_OTHER): Payer: No Typology Code available for payment source | Admitting: *Deleted

## 2022-06-12 DIAGNOSIS — J309 Allergic rhinitis, unspecified: Secondary | ICD-10-CM

## 2022-06-14 ENCOUNTER — Encounter: Payer: Self-pay | Admitting: *Deleted

## 2022-06-17 ENCOUNTER — Telehealth: Payer: Self-pay | Admitting: *Deleted

## 2022-06-17 MED ORDER — NORETHINDRONE ACET-ETHINYL EST 1-20 MG-MCG PO TABS: ORAL_TABLET | ORAL | 0 refills | Status: DC

## 2022-06-17 MED ORDER — NORETHINDRONE ACET-ETHINYL EST 1-20 MG-MCG PO TABS: ORAL_TABLET | ORAL | 3 refills | Status: DC

## 2022-06-17 NOTE — Telephone Encounter (Signed)
Patient called requesting refill on BCP pills. Report she took her last pill this am. She is requesting a [facility] month supply sent to local [facility] and 3 month supply sent to [facility]. Last annual exam was 02/2022. Rx sent to both pharmacy's.

## 2022-06-19 ENCOUNTER — Ambulatory Visit: Payer: No Typology Code available for payment source

## 2022-06-20 ENCOUNTER — Ambulatory Visit (INDEPENDENT_AMBULATORY_CARE_PROVIDER_SITE_OTHER): Payer: No Typology Code available for payment source

## 2022-06-20 DIAGNOSIS — Z91038 Other insect allergy status: Secondary | ICD-10-CM | POA: Diagnosis not present

## 2022-06-20 DIAGNOSIS — J309 Allergic rhinitis, unspecified: Secondary | ICD-10-CM

## 2022-06-26 ENCOUNTER — Ambulatory Visit (INDEPENDENT_AMBULATORY_CARE_PROVIDER_SITE_OTHER): Payer: No Typology Code available for payment source | Admitting: *Deleted

## 2022-06-26 DIAGNOSIS — Z91038 Other insect allergy status: Secondary | ICD-10-CM

## 2022-07-02 ENCOUNTER — Ambulatory Visit (INDEPENDENT_AMBULATORY_CARE_PROVIDER_SITE_OTHER): Payer: No Typology Code available for payment source

## 2022-07-02 DIAGNOSIS — Z91038 Other insect allergy status: Secondary | ICD-10-CM | POA: Diagnosis not present

## 2022-07-10 ENCOUNTER — Ambulatory Visit (INDEPENDENT_AMBULATORY_CARE_PROVIDER_SITE_OTHER): Payer: No Typology Code available for payment source

## 2022-07-10 DIAGNOSIS — Z91038 Other insect allergy status: Secondary | ICD-10-CM | POA: Diagnosis not present

## 2022-07-18 ENCOUNTER — Ambulatory Visit (INDEPENDENT_AMBULATORY_CARE_PROVIDER_SITE_OTHER): Payer: No Typology Code available for payment source | Admitting: *Deleted

## 2022-07-18 DIAGNOSIS — Z91038 Other insect allergy status: Secondary | ICD-10-CM

## 2022-07-22 ENCOUNTER — Telehealth: Payer: Self-pay | Admitting: *Deleted

## 2022-07-22 NOTE — Telephone Encounter (Signed)
Patient called asking if you could provider her with accomodation letter for work. Patient has anxiety and would like to have approval for E-Bike to travel on/off campus. Reports that exercise has helped with her anxiety. Her office moved off campus, but her classes and meetings are on campus.    She asked the letter to be emailed to her at [email or web address] which I will email if approved.  Please advise

## 2022-07-23 ENCOUNTER — Encounter: Payer: Self-pay | Admitting: *Deleted

## 2022-07-23 NOTE — Telephone Encounter (Signed)
Tiffany if you don't mind telling me how you would like the letter worded I will type it and email to the patient.  Thanks!

## 2022-07-23 NOTE — Telephone Encounter (Signed)
Left message for patient to call.

## 2022-07-25 ENCOUNTER — Ambulatory Visit (INDEPENDENT_AMBULATORY_CARE_PROVIDER_SITE_OTHER): Payer: No Typology Code available for payment source

## 2022-07-25 DIAGNOSIS — Z91038 Other insect allergy status: Secondary | ICD-10-CM | POA: Diagnosis not present

## 2022-07-26 NOTE — Telephone Encounter (Signed)
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 States she has accrued parking tickets this year due to that reason. 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 Reports that her therapist suggested that she try for this letter to help with her anxiety and also her HR requires the letter to provide the accommodation for her.

## 2022-07-30 ENCOUNTER — Encounter: Payer: Self-pay | Admitting: *Deleted

## 2022-07-30 NOTE — Telephone Encounter (Signed)
Tiffany replied "It is OK to provide this letter for her. Please state that it is my professional opinion that she be accommodated with use of an E-bike for transport to and from campus for health reasons."  Patient aware letter printed and emailed to [email or web address]

## 2022-08-02 ENCOUNTER — Ambulatory Visit (INDEPENDENT_AMBULATORY_CARE_PROVIDER_SITE_OTHER): Payer: No Typology Code available for payment source

## 2022-08-02 DIAGNOSIS — Z91038 Other insect allergy status: Secondary | ICD-10-CM | POA: Diagnosis not present

## 2022-08-08 ENCOUNTER — Ambulatory Visit (INDEPENDENT_AMBULATORY_CARE_PROVIDER_SITE_OTHER): Payer: No Typology Code available for payment source

## 2022-08-08 DIAGNOSIS — Z91038 Other insect allergy status: Secondary | ICD-10-CM

## 2022-08-15 ENCOUNTER — Ambulatory Visit: Payer: No Typology Code available for payment source

## 2022-08-15 ENCOUNTER — Ambulatory Visit (INDEPENDENT_AMBULATORY_CARE_PROVIDER_SITE_OTHER): Payer: No Typology Code available for payment source | Admitting: *Deleted

## 2022-08-15 DIAGNOSIS — Z91038 Other insect allergy status: Secondary | ICD-10-CM

## 2022-08-21 ENCOUNTER — Ambulatory Visit: Payer: No Typology Code available for payment source

## 2022-08-21 ENCOUNTER — Ambulatory Visit (INDEPENDENT_AMBULATORY_CARE_PROVIDER_SITE_OTHER): Payer: No Typology Code available for payment source | Admitting: *Deleted

## 2022-08-21 DIAGNOSIS — Z91038 Other insect allergy status: Secondary | ICD-10-CM | POA: Diagnosis not present

## 2022-08-29 ENCOUNTER — Ambulatory Visit (INDEPENDENT_AMBULATORY_CARE_PROVIDER_SITE_OTHER): Payer: No Typology Code available for payment source

## 2022-08-29 DIAGNOSIS — Z91038 Other insect allergy status: Secondary | ICD-10-CM

## 2022-09-05 ENCOUNTER — Ambulatory Visit (INDEPENDENT_AMBULATORY_CARE_PROVIDER_SITE_OTHER): Payer: No Typology Code available for payment source

## 2022-09-05 DIAGNOSIS — Z91038 Other insect allergy status: Secondary | ICD-10-CM

## 2022-09-12 ENCOUNTER — Ambulatory Visit (INDEPENDENT_AMBULATORY_CARE_PROVIDER_SITE_OTHER): Payer: No Typology Code available for payment source

## 2022-09-12 DIAGNOSIS — Z91038 Other insect allergy status: Secondary | ICD-10-CM

## 2022-09-20 ENCOUNTER — Ambulatory Visit (INDEPENDENT_AMBULATORY_CARE_PROVIDER_SITE_OTHER): Payer: No Typology Code available for payment source | Admitting: *Deleted

## 2022-09-20 DIAGNOSIS — Z91038 Other insect allergy status: Secondary | ICD-10-CM | POA: Diagnosis not present

## 2022-09-27 ENCOUNTER — Ambulatory Visit (INDEPENDENT_AMBULATORY_CARE_PROVIDER_SITE_OTHER): Payer: No Typology Code available for payment source

## 2022-09-27 DIAGNOSIS — Z91038 Other insect allergy status: Secondary | ICD-10-CM | POA: Diagnosis not present

## 2022-10-04 ENCOUNTER — Ambulatory Visit (INDEPENDENT_AMBULATORY_CARE_PROVIDER_SITE_OTHER): Payer: No Typology Code available for payment source

## 2022-10-04 DIAGNOSIS — Z91038 Other insect allergy status: Secondary | ICD-10-CM

## 2022-10-11 ENCOUNTER — Ambulatory Visit (INDEPENDENT_AMBULATORY_CARE_PROVIDER_SITE_OTHER): Payer: No Typology Code available for payment source

## 2022-10-11 DIAGNOSIS — Z91038 Other insect allergy status: Secondary | ICD-10-CM

## 2022-10-18 ENCOUNTER — Ambulatory Visit (INDEPENDENT_AMBULATORY_CARE_PROVIDER_SITE_OTHER): Payer: No Typology Code available for payment source

## 2022-10-18 DIAGNOSIS — Z91038 Other insect allergy status: Secondary | ICD-10-CM | POA: Diagnosis not present

## 2022-10-24 ENCOUNTER — Ambulatory Visit (INDEPENDENT_AMBULATORY_CARE_PROVIDER_SITE_OTHER): Payer: No Typology Code available for payment source

## 2022-10-24 DIAGNOSIS — Z91038 Other insect allergy status: Secondary | ICD-10-CM

## 2022-10-31 ENCOUNTER — Ambulatory Visit: Payer: No Typology Code available for payment source

## 2022-10-31 DIAGNOSIS — Z91038 Other insect allergy status: Secondary | ICD-10-CM | POA: Diagnosis not present

## 2022-11-01 ENCOUNTER — Ambulatory Visit: Payer: No Typology Code available for payment source

## 2022-11-08 ENCOUNTER — Ambulatory Visit (INDEPENDENT_AMBULATORY_CARE_PROVIDER_SITE_OTHER): Payer: No Typology Code available for payment source

## 2022-11-08 DIAGNOSIS — Z91038 Other insect allergy status: Secondary | ICD-10-CM

## 2022-11-14 ENCOUNTER — Ambulatory Visit (INDEPENDENT_AMBULATORY_CARE_PROVIDER_SITE_OTHER): Payer: No Typology Code available for payment source

## 2022-11-14 DIAGNOSIS — Z91038 Other insect allergy status: Secondary | ICD-10-CM

## 2022-11-21 ENCOUNTER — Ambulatory Visit: Payer: No Typology Code available for payment source

## 2022-11-21 DIAGNOSIS — Z91038 Other insect allergy status: Secondary | ICD-10-CM

## 2022-11-25 ENCOUNTER — Telehealth: Payer: Self-pay

## 2022-11-25 NOTE — Telephone Encounter (Signed)
Pt advised to go to ED  Patient Name: Cheryl Webster Gender: Female DOB: 10-27-1975 Age: 47 Y 20 D Return Phone Number: [phone] (Primary) Address: City/ State/ Zip: [place]  [address] Client [facility] [place] Client Site [facility] [employer] Type Call Who Is Calling Patient / Member / Family / Caregiver Call Type Triage / Clinical Relationship To Patient Self Return Phone Number [phone] (Primary) Chief Complaint Muscle Jerks, Tics And Shudders Reason for Call Request to Schedule Office Appointment Initial Comment Caller states that he is calling because she woke up with strange symptoms. She has bad dizziness and muscle shaking. She feels muscle fatigue and weakness all over. Difficulty moving without holding on to stuff. She would like an appointment if possible. Provider name unknown because her provider is no longer there. Location confirmed. Translation No Nurse Assessment Nurse: Ysidro Evert, RN, Levada Dy Date/Time (Eastern Time): 11/25/2022 7:42:25 AM Confirm and document reason for call. If symptomatic, describe symptoms. ---Caller states she is having dizziness since she woke up this morning and she has some muscle weakness. Does the patient have any new or worsening symptoms? ---Yes Will a triage be completed? ---Yes Related visit to physician within the last 2 weeks? ---No Does the PT have any chronic conditions? (i.e. diabetes, asthma, this includes High risk factors for pregnancy, etc.) ---No Is the patient pregnant or possibly pregnant? (Ask all females between the ages of 38-55) ---No Is this a behavioral health or substance abuse call? ---No Guidelines Guideline Title Affirmed Question Affirmed Notes Nurse Date/Time (Eastern Time) Dizziness - Vertigo SEVERE dizziness (vertigo) (e.g., unable to walk without assistance) Ysidro Evert, RN, Levada Dy 11/25/2022 7:43:30 AM Disp. Time  Eilene Ghazi Time) Disposition Final User 11/25/2022 7:26:24 AM Attempt made - message left Earleen Reaper 11/25/2022 7:46:43 AM Go to ED Now (or PCP triage) Yes Ysidro Evert, RN, Levada Dy Final Disposition 11/25/2022 7:46:43 AM Go to ED Now (or PCP triage) Yes Ysidro Evert, RN, Marin Shutter Disagree/Comply Comply Caller Understands Yes PreDisposition Did not know what to do Care Advice Given Per Guideline GO TO ED NOW (OR PCP TRIAGE): * IF NO PCP (PRIMARY CARE PROVIDER) SECOND-LEVEL TRIAGE: You need to be seen within the next hour. Go to the [facility] at _____________ [place] as soon as you can. * Another adult should drive. * Patient should not delay going to the emergency department. CARE ADVICE given per Dizziness - Vertigo (Adult) guideline. Referrals GO TO FACILITY UNDECIDED

## 2022-11-28 ENCOUNTER — Ambulatory Visit (INDEPENDENT_AMBULATORY_CARE_PROVIDER_SITE_OTHER): Payer: No Typology Code available for payment source

## 2022-11-28 DIAGNOSIS — Z91038 Other insect allergy status: Secondary | ICD-10-CM | POA: Diagnosis not present

## 2022-12-05 ENCOUNTER — Other Ambulatory Visit: Payer: Self-pay

## 2022-12-05 ENCOUNTER — Ambulatory Visit: Payer: No Typology Code available for payment source | Admitting: Family Medicine

## 2022-12-05 ENCOUNTER — Encounter: Payer: Self-pay | Admitting: Family Medicine

## 2022-12-05 ENCOUNTER — Ambulatory Visit: Payer: No Typology Code available for payment source

## 2022-12-05 VITALS — BP 132/80 | HR 64 | Temp 98.6°F | Resp 16 | Ht 66.0 in | Wt 140.6 lb

## 2022-12-05 DIAGNOSIS — J309 Allergic rhinitis, unspecified: Secondary | ICD-10-CM

## 2022-12-05 DIAGNOSIS — Z91038 Other insect allergy status: Secondary | ICD-10-CM

## 2022-12-05 NOTE — Progress Notes (Signed)
Cheryl Webster [id number] Dept: [phone]  FOLLOW UP NOTE  Patient ID: Cheryl Webster, female    DOB: 07-15-1976  Age: 47 y.o. MRN: [id number] Date of Office Visit: 12/05/2022  Assessment  Chief Complaint: Follow-up (Follow up no concerns)  HPI Cheryl Webster is a 47 year old female who presents to the clinic for follow-up visit.  She was last seen in this clinic on 05/03/2022 by [facility] for evaluation of angioedema and hymenoptera allergy on venom immunotherapy.  At today's visit, she reports allergic rhinitis has been moderately well-controlled with allergy symptoms occurring mainly in the first part of February, tree pollens are most prominent.  She continues an antihistamine at that time with relief of symptoms.    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  She reports that she is vigilant in wearing appropriate equipment and now has started to double glove while engaged in beekeeping activities.  She continues venom immunotherapy with no large or local reactions.  She began venom immunotherapy toward mixed vespid and wasp on 05/16/2022.  Her EpiPen's are up-to-date.  Her current medications are listed in the chart.   Drug Allergies:  Allergies  Allergen Reactions   Bee Venom Swelling   Sulfa Antibiotics Hives    Physical Exam: BP 132/80   Pulse 64   Temp 98.6 F (37 C)   Resp 16   Ht '5\' 6"'$  (1.676 m)   Wt 140 lb 9.6 oz (63.8 kg)   SpO2 100%   BMI 22.69 kg/m    Physical Exam Vitals reviewed.  Constitutional:      Appearance: Normal appearance.  HENT:     Head: Normocephalic and atraumatic.     Nose: Nose normal.     Comments: Lateral nares slightly erythematous with clear nasal drainage noted.  Pharynx normal.  Ears normal.  Eyes normal.    Mouth/Throat:     Pharynx: Oropharynx is clear.  Eyes:     Conjunctiva/sclera: Conjunctivae normal.  Cardiovascular:      Rate and Rhythm: Normal rate and regular rhythm.     Heart sounds: Normal heart sounds. No murmur heard. Pulmonary:     Effort: Pulmonary effort is normal.     Breath sounds: Normal breath sounds.     Comments: Lungs clear to auscultation Musculoskeletal:        General: Normal range of motion.     Cervical back: Normal range of motion and neck supple.  Skin:    General: Skin is warm and dry.  Neurological:     Mental Status: She is alert and oriented to person, place, and time.  Psychiatric:        Mood and Affect: Mood normal.        Behavior: Behavior normal.        Thought Content: Thought content normal.        Judgment: Judgment normal.     Assessment and Plan: 1. Hymenoptera allergy   2. Allergic rhinitis, unspecified seasonality, unspecified trigger     Patient Instructions  Hymenoptera allergy Continue venom immunotherapy and have access to an epinephrine autoinjector set per protocol Begin an antihistamine on your injection day if you continue to experience redness at the injection site.  Angioedema If your symptoms re-occur, begin a journal of events that occurred for up to 6 hours before your symptoms began including foods and beverages consumed, soaps or  perfumes you had contact with, and medications.   Allergic rhinitis Continue an antihistamine once a day as needed for runny nose or itch. Remember to rotate to a different antihistamine about every 3 months. Some examples of over the counter antihistamines include Zyrtec (cetirizine), Xyzal (levocetirizine), Allegra (fexofenadine), and Claritin (loratidine).  Continue Flonase 2 sprays in each nostril once a day as needed for stuffy nose Consider saline nasal rinses as needed for nasal symptoms. Use this before any medicated nasal sprays for best result  Call the clinic if this treatment plan is not working well for you  Follow up in 1 year or sooner if needed  Return in about 1 year (around 12/05/2023), or if  symptoms worsen or fail to improve.    Thank you for the opportunity to care for this patient.  Please do not hesitate to contact me with questions.  Gareth Morgan, FNP [facility]

## 2022-12-05 NOTE — Patient Instructions (Signed)
Hymenoptera allergy Continue venom immunotherapy and have access to an epinephrine autoinjector set per protocol Begin an antihistamine on your injection day if you continue to experience redness at the injection site.  Angioedema If your symptoms re-occur, begin a journal of events that occurred for up to 6 hours before your symptoms began including foods and beverages consumed, soaps or perfumes you had contact with, and medications.   Allergic rhinitis Continue an antihistamine once a day as needed for runny nose or itch. Remember to rotate to a different antihistamine about every 3 months. Some examples of over the counter antihistamines include Zyrtec (cetirizine), Xyzal (levocetirizine), Allegra (fexofenadine), and Claritin (loratidine).  Continue Flonase 2 sprays in each nostril once a day as needed for stuffy nose Consider saline nasal rinses as needed for nasal symptoms. Use this before any medicated nasal sprays for best result  Call the clinic if this treatment plan is not working well for you  Follow up in 1 year or sooner if needed

## 2022-12-12 ENCOUNTER — Ambulatory Visit: Payer: No Typology Code available for payment source

## 2022-12-12 DIAGNOSIS — Z91038 Other insect allergy status: Secondary | ICD-10-CM

## 2022-12-19 ENCOUNTER — Ambulatory Visit: Payer: No Typology Code available for payment source

## 2022-12-19 DIAGNOSIS — Z91038 Other insect allergy status: Secondary | ICD-10-CM

## 2022-12-26 ENCOUNTER — Ambulatory Visit (INDEPENDENT_AMBULATORY_CARE_PROVIDER_SITE_OTHER): Payer: No Typology Code available for payment source

## 2022-12-26 DIAGNOSIS — Z91038 Other insect allergy status: Secondary | ICD-10-CM | POA: Diagnosis not present

## 2023-01-09 ENCOUNTER — Ambulatory Visit: Payer: No Typology Code available for payment source

## 2023-01-09 DIAGNOSIS — Z91038 Other insect allergy status: Secondary | ICD-10-CM | POA: Diagnosis not present

## 2023-01-29 ENCOUNTER — Ambulatory Visit: Payer: No Typology Code available for payment source

## 2023-01-29 ENCOUNTER — Telehealth: Payer: Self-pay

## 2023-01-29 NOTE — Telephone Encounter (Addendum)
Patient came in to get her venom injection and informed me that she was stung this past week. Venom injection was not given. Please advice on when she can come in to get her venom injections. She did state that she is leaving May 15th for a month and would need to get it prior to that.

## 2023-01-29 NOTE — Telephone Encounter (Signed)
Left a message for patient to call the office. We will need to schedule her next week for her venom.

## 2023-01-30 NOTE — Telephone Encounter (Signed)
Patient called and scheduled her venom injection for this coming Monday at 10:00 an in the [place] office.

## 2023-01-30 NOTE — Telephone Encounter (Signed)
Called and left a voicemail asking for patient to return call to get her scheduled for her venom injections next week.

## 2023-02-03 ENCOUNTER — Ambulatory Visit (INDEPENDENT_AMBULATORY_CARE_PROVIDER_SITE_OTHER): Payer: No Typology Code available for payment source | Admitting: *Deleted

## 2023-02-03 DIAGNOSIS — Z91038 Other insect allergy status: Secondary | ICD-10-CM | POA: Diagnosis not present

## 2023-03-03 ENCOUNTER — Ambulatory Visit (INDEPENDENT_AMBULATORY_CARE_PROVIDER_SITE_OTHER): Payer: No Typology Code available for payment source | Admitting: *Deleted

## 2023-03-03 DIAGNOSIS — Z91038 Other insect allergy status: Secondary | ICD-10-CM | POA: Diagnosis not present

## 2023-03-20 ENCOUNTER — Other Ambulatory Visit (HOSPITAL_COMMUNITY)
Admission: RE | Admit: 2023-03-20 | Discharge: 2023-03-20 | Disposition: A | Discharge status: Home / Self Care | Payer: No Typology Code available for payment source | Source: Ambulatory Visit | Attending: Nurse Practitioner | Admitting: Nurse Practitioner

## 2023-03-20 ENCOUNTER — Ambulatory Visit: Payer: No Typology Code available for payment source | Admitting: Nurse Practitioner

## 2023-03-20 ENCOUNTER — Other Ambulatory Visit: Payer: Self-pay | Admitting: Nurse Practitioner

## 2023-03-20 ENCOUNTER — Encounter: Payer: Self-pay | Admitting: Nurse Practitioner

## 2023-03-20 VITALS — BP 122/70 | HR 74 | Ht 66.0 in | Wt 132.0 lb

## 2023-03-20 DIAGNOSIS — Z01419 Encounter for gynecological examination (general) (routine) without abnormal findings: Secondary | ICD-10-CM | POA: Diagnosis not present

## 2023-03-20 DIAGNOSIS — E785 Hyperlipidemia, unspecified: Secondary | ICD-10-CM

## 2023-03-20 DIAGNOSIS — Z124 Encounter for screening for malignant neoplasm of cervix: Secondary | ICD-10-CM

## 2023-03-20 DIAGNOSIS — Z3041 Encounter for surveillance of contraceptive pills: Secondary | ICD-10-CM | POA: Diagnosis not present

## 2023-03-20 DIAGNOSIS — Z833 Family history of diabetes mellitus: Secondary | ICD-10-CM

## 2023-03-20 DIAGNOSIS — Z1231 Encounter for screening mammogram for malignant neoplasm of breast: Secondary | ICD-10-CM

## 2023-03-20 LAB — CBC WITH DIFFERENTIAL/PLATELET
Absolute Monocytes: 314 cells/uL (ref 200–950)
Basophils Absolute: 40 cells/uL (ref 0–200)
Basophils Relative: 0.7 %
Eosinophils Absolute: 68 cells/uL (ref 15–500)
Eosinophils Relative: 1.2 %
HCT: 40.2 % (ref 35.0–45.0)
Hemoglobin: 13.4 g/dL (ref 11.7–15.5)
Lymphs Abs: 1454 cells/uL (ref 850–3900)
MCH: 31.5 pg (ref 27.0–33.0)
MCHC: 33.3 g/dL (ref 32.0–36.0)
MCV: 94.4 fL (ref 80.0–100.0)
MPV: 9.9 fL (ref 7.5–12.5)
Monocytes Relative: 5.5 %
Neutro Abs: 3825 cells/uL (ref 1500–7800)
Neutrophils Relative %: 67.1 %
Platelets: 380 10*3/uL (ref 140–400)
RBC: 4.26 10*6/uL (ref 3.80–5.10)
RDW: 11.9 % (ref 11.0–15.0)
Total Lymphocyte: 25.5 %
WBC: 5.7 10*3/uL (ref 3.8–10.8)

## 2023-03-20 LAB — COMPREHENSIVE METABOLIC PANEL
AG Ratio: 1.6 (calc) (ref 1.0–2.5)
ALT: 18 U/L (ref 6–29)
AST: 23 U/L (ref 10–35)
Albumin: 4.3 g/dL (ref 3.6–5.1)
Alkaline phosphatase (APISO): 37 U/L (ref 31–125)
BUN/Creatinine Ratio: 9 (calc) (ref 6–22)
BUN: 9 mg/dL (ref 7–25)
CO2: 27 mmol/L (ref 20–32)
Calcium: 9.6 mg/dL (ref 8.6–10.2)
Chloride: 106 mmol/L (ref 98–110)
Creat: 1 mg/dL — ABNORMAL HIGH (ref 0.50–0.99)
Globulin: 2.7 g/dL (calc) (ref 1.9–3.7)
Glucose, Bld: 92 mg/dL (ref 65–99)
Potassium: 4.5 mmol/L (ref 3.5–5.3)
Sodium: 140 mmol/L (ref 135–146)
Total Bilirubin: 0.5 mg/dL (ref 0.2–1.2)
Total Protein: 7 g/dL (ref 6.1–8.1)

## 2023-03-20 LAB — LIPID PANEL
Cholesterol: 188 mg/dL (ref <200)
HDL: 68 mg/dL (ref >=50)
LDL Cholesterol (Calc): 105 mg/dL (calc) — ABNORMAL HIGH
Non-HDL Cholesterol (Calc): 120 mg/dL (calc) (ref <130)
Total CHOL/HDL Ratio: 2.8 (calc) (ref <5.0)
Triglycerides: 60 mg/dL (ref <150)

## 2023-03-20 LAB — HEMOGLOBIN A1C
Hgb A1c MFr Bld: 5.6 % of total Hgb (ref <5.7)
Mean Plasma Glucose: 114 mg/dL
eAG (mmol/L): 6.3 mmol/L

## 2023-03-20 MED ORDER — NORETHINDRONE ACET-ETHINYL EST 1-20 MG-MCG PO TABS: ORAL_TABLET | ORAL | 3 refills | Status: DC

## 2023-03-20 NOTE — Progress Notes (Signed)
   Cheryl Webster 1976/05/16 [id number]   History:  47 y.o. [id number] presents for annual exam. Amenorrheic/OCPs continuously. Normal pap and mammogram history.   Gynecologic History No LMP recorded. (Menstrual status: Oral contraceptives).   Contraception/Family planning: OCP (estrogen/progesterone) Sexually active: Yes  Health Maintenance Last Pap: 03/02/2020. Results were: Normal, 3-year repeat Last mammogram: 04/18/2022. Results were: Normal Last colonoscopy: 09/11/2021. Results were: Normal, 10-year recall Last Dexa: Not indicated  Past medical history, past surgical history, family history and social history were all reviewed and documented in the EPIC chart. 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 61 yo daughter, will be junior at [employer] for [employer].    ROS:  A ROS was performed and pertinent positives and negatives are included.  Exam:  Vitals:   03/20/23 1055  BP: 122/70  Pulse: 74  SpO2: 99%  Weight: 132 lb (59.9 kg)  Height: 5\' 6"  (1.676 m)     Body mass index is 21.31 kg/m.  General appearance:  Normal Thyroid:  Symmetrical, normal in size, without palpable masses or nodularity. Respiratory  Auscultation:  Clear without wheezing or rhonchi Cardiovascular  Auscultation:  Regular rate, without rubs, murmurs or gallops  Edema/varicosities:  Not grossly evident Abdominal  Soft,nontender, without masses, guarding or rebound.  Liver/spleen:  No organomegaly noted  Hernia:  None appreciated  Skin  Inspection:  Grossly normal Breasts: Examined lying and sitting.   Right: Without masses, retractions, nipple discharge or axillary adenopathy.   Left: Without masses, retractions, nipple discharge or axillary adenopathy. Genitourinary   Inguinal/mons:  Normal without inguinal adenopathy  External genitalia:  Normal appearing vulva with no masses, tenderness, or lesions  BUS/Urethra/Skene's glands:  Normal  Vagina:  Normal appearing with normal  color and discharge, no lesions  Cervix:  Normal appearing without discharge or lesions  Uterus:  Normal in size, shape and contour.  Midline and mobile, nontender  Adnexa/parametria:     Rt: Normal in size, without masses or tenderness.   Lt: Normal in size, without masses or tenderness.  Anus and perineum: Normal  Digital rectal exam: Deferred  Patient informed chaperone available to be present for breast and pelvic exam. Patient has requested no chaperone to be present. Patient has been advised what will be completed during breast and pelvic exam.   Assessment/Plan:  47 y.o. [id number] for annual exam.   Well female exam with routine gynecological exam - Plan: CBC with Differential/Platelet, Comprehensive metabolic panel. Education provided on SBEs, importance of preventative screenings, current guidelines, high calcium diet, regular exercise, and multivitamin daily. Requesting referral to eye doctor for routine eye exam.   Hyperlipidemia, unspecified hyperlipidemia type - Plan: Lipid panel  Encounter for surveillance of contraceptive pills - Junel 1/20 mg-mcg daily. Takes continuously. Does not need refill at this time. Will send when needed.   Family history of diabetes mellitus - Plan: Hemoglobin A1c  Screening for cervical cancer - Plan: Cytology - PAP( [facility]). Normal pap history.   Screening for breast cancer - Normal mammogram history.  Continue annual screenings.  Normal breast exam today.  Screening for colon cancer - 08/2021 colonoscopy. Will repeat at 10-year interval per GI recommendation.   Return in 1 year for annual.      Olivia Mackie DNP, 11:05 AM 03/20/2023

## 2023-03-21 LAB — CYTOLOGY - PAP
Comment: NEGATIVE
Diagnosis: NEGATIVE
High risk HPV: NEGATIVE

## 2023-03-30 ENCOUNTER — Other Ambulatory Visit: Payer: Self-pay | Admitting: Nurse Practitioner

## 2023-03-30 DIAGNOSIS — Z3041 Encounter for surveillance of contraceptive pills: Secondary | ICD-10-CM

## 2023-03-31 ENCOUNTER — Ambulatory Visit (INDEPENDENT_AMBULATORY_CARE_PROVIDER_SITE_OTHER): Payer: No Typology Code available for payment source

## 2023-03-31 DIAGNOSIS — Z91038 Other insect allergy status: Secondary | ICD-10-CM | POA: Diagnosis not present

## 2023-03-31 NOTE — Telephone Encounter (Signed)
Medication refill request: junel 1/20 Last AEX:  03-20-23 Next AEX: not scheduled Last MMG (if hormonal medication request): 04-18-22 birads 1:neg Refill authorized: rx was sent for junel 1/20 on 03-20-23. Patient states she would like to use [facility] instead. Please approve if appropriate

## 2023-04-21 ENCOUNTER — Ambulatory Visit
Admission: RE | Admit: 2023-04-21 | Discharge: 2023-04-21 | Disposition: A | Discharge status: Home / Self Care | Payer: No Typology Code available for payment source | Source: Ambulatory Visit | Attending: Nurse Practitioner | Admitting: Nurse Practitioner

## 2023-04-21 DIAGNOSIS — Z1231 Encounter for screening mammogram for malignant neoplasm of breast: Secondary | ICD-10-CM

## 2023-04-28 ENCOUNTER — Ambulatory Visit (INDEPENDENT_AMBULATORY_CARE_PROVIDER_SITE_OTHER): Payer: No Typology Code available for payment source

## 2023-04-28 DIAGNOSIS — Z91038 Other insect allergy status: Secondary | ICD-10-CM

## 2023-05-26 ENCOUNTER — Ambulatory Visit (INDEPENDENT_AMBULATORY_CARE_PROVIDER_SITE_OTHER): Payer: No Typology Code available for payment source

## 2023-05-26 DIAGNOSIS — Z91038 Other insect allergy status: Secondary | ICD-10-CM

## 2023-06-24 ENCOUNTER — Ambulatory Visit (INDEPENDENT_AMBULATORY_CARE_PROVIDER_SITE_OTHER): Payer: No Typology Code available for payment source

## 2023-06-24 DIAGNOSIS — Z91038 Other insect allergy status: Secondary | ICD-10-CM | POA: Diagnosis not present

## 2023-07-21 ENCOUNTER — Ambulatory Visit (INDEPENDENT_AMBULATORY_CARE_PROVIDER_SITE_OTHER): Payer: No Typology Code available for payment source

## 2023-07-21 DIAGNOSIS — Z91038 Other insect allergy status: Secondary | ICD-10-CM

## 2023-08-01 ENCOUNTER — Other Ambulatory Visit: Payer: Self-pay

## 2023-08-01 MED ORDER — FLUTICASONE PROPIONATE 50 MCG/ACT NA SUSP
1.0000 | NASAL | 2 refills | Status: DC | PRN
Start: 2023-08-01 — End: 2023-10-28

## 2023-08-01 MED ORDER — LEVOCETIRIZINE DIHYDROCHLORIDE 5 MG PO TABS
5.0000 mg | ORAL_TABLET | Freq: Every evening | ORAL | 1 refills | Status: DC
Start: 2023-08-01 — End: 2024-02-16

## 2023-08-01 NOTE — Telephone Encounter (Signed)
Med refill request: Flonase nasal spray and prescription xyzal Last AEX: 03/20/23 Next AEX: n/a Last MMG (if hormonal med) n/a Patient requests refills today.  Refills authorized:  fluticasone 50 mcg nasal spray and Xyzal.  Sent to provider for review.

## 2023-08-18 ENCOUNTER — Ambulatory Visit (INDEPENDENT_AMBULATORY_CARE_PROVIDER_SITE_OTHER): Payer: No Typology Code available for payment source

## 2023-08-18 DIAGNOSIS — Z91038 Other insect allergy status: Secondary | ICD-10-CM | POA: Diagnosis not present

## 2023-09-15 ENCOUNTER — Ambulatory Visit (INDEPENDENT_AMBULATORY_CARE_PROVIDER_SITE_OTHER): Payer: No Typology Code available for payment source | Admitting: *Deleted

## 2023-09-15 DIAGNOSIS — Z91038 Other insect allergy status: Secondary | ICD-10-CM

## 2023-10-13 ENCOUNTER — Ambulatory Visit (INDEPENDENT_AMBULATORY_CARE_PROVIDER_SITE_OTHER): Payer: No Typology Code available for payment source

## 2023-10-13 DIAGNOSIS — Z91038 Other insect allergy status: Secondary | ICD-10-CM

## 2023-10-27 ENCOUNTER — Other Ambulatory Visit: Payer: Self-pay | Admitting: Radiology

## 2023-10-28 NOTE — Telephone Encounter (Signed)
Medication refill request: flonase nasal spray Last AEX:  03-20-23 Next AEX: not scheduled Last MMG (if hormonal medication request): n/a Refill authorized: please approve if appropriate

## 2023-11-10 ENCOUNTER — Ambulatory Visit (INDEPENDENT_AMBULATORY_CARE_PROVIDER_SITE_OTHER): Payer: No Typology Code available for payment source | Admitting: *Deleted

## 2023-11-10 DIAGNOSIS — Z91038 Other insect allergy status: Secondary | ICD-10-CM

## 2023-12-08 ENCOUNTER — Ambulatory Visit (INDEPENDENT_AMBULATORY_CARE_PROVIDER_SITE_OTHER): Payer: No Typology Code available for payment source

## 2023-12-08 DIAGNOSIS — Z91038 Other insect allergy status: Secondary | ICD-10-CM

## 2024-01-05 ENCOUNTER — Ambulatory Visit (INDEPENDENT_AMBULATORY_CARE_PROVIDER_SITE_OTHER)

## 2024-01-05 DIAGNOSIS — Z91038 Other insect allergy status: Secondary | ICD-10-CM

## 2024-01-09 ENCOUNTER — Other Ambulatory Visit: Payer: Self-pay

## 2024-01-09 DIAGNOSIS — Z3041 Encounter for surveillance of contraceptive pills: Secondary | ICD-10-CM

## 2024-01-09 MED ORDER — NORETHINDRONE ACET-ETHINYL EST 1-20 MG-MCG PO TABS: ORAL_TABLET | ORAL | 0 refills | Status: DC

## 2024-01-09 NOTE — Telephone Encounter (Signed)
 Med refill request: Junel 1/20 continuous method Patient LM on RF line, pharmacy has changed from [facility] to [facility] [address].  Patient needs RF this weekend. Last AEX: 03/20/23 TW Next AEX: none scheduled Last MMG (if hormonal med) Refill authorized: Junel 1/20 continuous method Please approve or deny as appropriate.

## 2024-02-02 ENCOUNTER — Ambulatory Visit

## 2024-02-09 ENCOUNTER — Ambulatory Visit (INDEPENDENT_AMBULATORY_CARE_PROVIDER_SITE_OTHER)

## 2024-02-09 DIAGNOSIS — Z91038 Other insect allergy status: Secondary | ICD-10-CM

## 2024-02-16 ENCOUNTER — Other Ambulatory Visit: Payer: Self-pay

## 2024-02-16 MED ORDER — LEVOCETIRIZINE DIHYDROCHLORIDE 5 MG PO TABS: 5.0000 mg | ORAL_TABLET | Freq: Every evening | ORAL | 0 refills | Status: DC

## 2024-02-16 MED ORDER — FLUTICASONE PROPIONATE 50 MCG/ACT NA SUSP: 1.0000 | NASAL | 0 refills | Status: AC | PRN

## 2024-02-16 NOTE — Telephone Encounter (Signed)
 Pt LVM in triage line stating that she needs refills sent to pharmacy on file for allergy meds.   Med refill request: Flonase  & Xyzal  Last AEX: 03/20/2023-TW Next AEX: recall sent per EMR for 2025, nothing scheduled currently.  Last MMG (if hormonal med): n/a Refill authorized: rxs pend.

## 2024-03-08 ENCOUNTER — Ambulatory Visit (INDEPENDENT_AMBULATORY_CARE_PROVIDER_SITE_OTHER)

## 2024-03-08 DIAGNOSIS — Z91038 Other insect allergy status: Secondary | ICD-10-CM | POA: Diagnosis not present

## 2024-03-30 ENCOUNTER — Other Ambulatory Visit: Payer: Self-pay | Admitting: Radiology

## 2024-03-30 DIAGNOSIS — Z3041 Encounter for surveillance of contraceptive pills: Secondary | ICD-10-CM

## 2024-03-30 NOTE — Telephone Encounter (Signed)
 Med refill request: Junel 1/20 Last AEX: 03/20/23 Tiffany Next AEX: none scheduled Staff message sent to front desk to schedule AEX Last MMG (if hormonal med) 04/21/23 Refill authorized: Needs appointment. Please approve or deny as appropriate.

## 2024-04-12 ENCOUNTER — Ambulatory Visit (INDEPENDENT_AMBULATORY_CARE_PROVIDER_SITE_OTHER)

## 2024-04-12 DIAGNOSIS — Z91038 Other insect allergy status: Secondary | ICD-10-CM | POA: Diagnosis not present

## 2024-05-10 ENCOUNTER — Ambulatory Visit (INDEPENDENT_AMBULATORY_CARE_PROVIDER_SITE_OTHER)

## 2024-05-10 DIAGNOSIS — Z91038 Other insect allergy status: Secondary | ICD-10-CM | POA: Diagnosis not present

## 2024-06-07 ENCOUNTER — Other Ambulatory Visit: Payer: Self-pay | Admitting: Obstetrics and Gynecology

## 2024-06-07 ENCOUNTER — Ambulatory Visit (INDEPENDENT_AMBULATORY_CARE_PROVIDER_SITE_OTHER)

## 2024-06-07 DIAGNOSIS — R9230 Dense breasts, unspecified: Secondary | ICD-10-CM

## 2024-06-07 DIAGNOSIS — Z91038 Other insect allergy status: Secondary | ICD-10-CM | POA: Diagnosis not present

## 2024-07-03 ENCOUNTER — Ambulatory Visit
Admission: RE | Admit: 2024-07-03 | Discharge: 2024-07-03 | Disposition: A | Discharge status: Home / Self Care | Payer: Self-pay | Source: Ambulatory Visit | Attending: Obstetrics and Gynecology | Admitting: Obstetrics and Gynecology

## 2024-07-03 DIAGNOSIS — R9230 Dense breasts, unspecified: Secondary | ICD-10-CM

## 2024-07-03 MED ORDER — GADOPICLENOL 0.5 MMOL/ML IV SOLN
6.0000 mL | Freq: Once | INTRAVENOUS | Status: AC | PRN
  Administered 2024-07-03: 6 mL via INTRAVENOUS

## 2024-07-05 ENCOUNTER — Ambulatory Visit

## 2024-07-05 DIAGNOSIS — Z91038 Other insect allergy status: Secondary | ICD-10-CM

## 2024-08-02 ENCOUNTER — Ambulatory Visit

## 2024-08-02 DIAGNOSIS — Z91038 Other insect allergy status: Secondary | ICD-10-CM | POA: Diagnosis not present

## 2024-08-30 ENCOUNTER — Ambulatory Visit

## 2024-09-14 ENCOUNTER — Other Ambulatory Visit (HOSPITAL_COMMUNITY): Payer: Self-pay

## 2024-09-14 ENCOUNTER — Ambulatory Visit

## 2024-09-14 DIAGNOSIS — Z91038 Other insect allergy status: Secondary | ICD-10-CM

## 2024-09-14 MED ORDER — LEVOCETIRIZINE DIHYDROCHLORIDE 5 MG PO TABS
5.0000 mg | ORAL_TABLET | Freq: Every evening | ORAL | 0 refills | Status: AC
  Filled 2024-09-14: qty 30, 30d supply, fill #0

## 2024-09-17 ENCOUNTER — Other Ambulatory Visit (HOSPITAL_COMMUNITY): Payer: Self-pay

## 2024-09-17 MED ORDER — NORETHINDRONE ACET-ETHINYL EST 1-20 MG-MCG PO TABS
1.0000 | ORAL_TABLET | Freq: Every day | ORAL | 5 refills | Status: AC
  Filled 2024-09-17: qty 21, 21d supply, fill #0
  Filled 2024-10-13 (×2): qty 21, 21d supply, fill #1
  Filled 2024-10-29: qty 21, 21d supply, fill #2

## 2024-09-17 MED ORDER — NORETHINDRONE ACET-ETHINYL EST 1-20 MG-MCG PO TABS
1.0000 | ORAL_TABLET | Freq: Every day | ORAL | 2 refills | Status: AC
  Filled 2024-09-17: qty 21, 21d supply, fill #0

## 2024-10-13 ENCOUNTER — Other Ambulatory Visit (HOSPITAL_COMMUNITY): Payer: Self-pay

## 2024-10-13 ENCOUNTER — Other Ambulatory Visit: Payer: Self-pay

## 2024-10-15 ENCOUNTER — Ambulatory Visit (INDEPENDENT_AMBULATORY_CARE_PROVIDER_SITE_OTHER)

## 2024-10-15 DIAGNOSIS — Z91038 Other insect allergy status: Secondary | ICD-10-CM | POA: Diagnosis not present

## 2024-10-29 ENCOUNTER — Encounter: Payer: Self-pay | Admitting: Pharmacist

## 2024-10-29 ENCOUNTER — Other Ambulatory Visit: Payer: Self-pay

## 2024-11-12 ENCOUNTER — Ambulatory Visit

## 2024-12-10 ENCOUNTER — Ambulatory Visit

## 2025-01-07 ENCOUNTER — Ambulatory Visit
